# Patient Record
Sex: Male | Born: 1935 | Race: Black or African American | Hispanic: No | Smoking: Never smoker
Health system: Southern US, Community
[De-identification: ages and names within clinical notes are randomized; demographics above are authoritative.]

## PROBLEM LIST (undated history)

## (undated) DIAGNOSIS — H9190 Unspecified hearing loss, unspecified ear: Secondary | ICD-10-CM

## (undated) DIAGNOSIS — E119 Type 2 diabetes mellitus without complications: Secondary | ICD-10-CM

## (undated) DIAGNOSIS — I10 Essential (primary) hypertension: Secondary | ICD-10-CM

## (undated) HISTORY — PX: APPENDECTOMY: SHX54

---

## 2020-08-23 ENCOUNTER — Emergency Department (HOSPITAL_COMMUNITY): Payer: Medicare Other

## 2020-08-23 ENCOUNTER — Other Ambulatory Visit: Payer: Self-pay

## 2020-08-23 ENCOUNTER — Encounter (HOSPITAL_COMMUNITY): Payer: Self-pay | Admitting: *Deleted

## 2020-08-23 ENCOUNTER — Inpatient Hospital Stay (HOSPITAL_COMMUNITY)
Admission: EM | Admit: 2020-08-23 | Discharge: 2020-08-27 | DRG: 683 | Disposition: A | Discharge status: Home Health | Payer: Medicare Other | Attending: Family Medicine | Admitting: Family Medicine

## 2020-08-23 DIAGNOSIS — N281 Cyst of kidney, acquired: Secondary | ICD-10-CM | POA: Diagnosis present

## 2020-08-23 DIAGNOSIS — E872 Acidosis, unspecified: Secondary | ICD-10-CM | POA: Diagnosis present

## 2020-08-23 DIAGNOSIS — R197 Diarrhea, unspecified: Secondary | ICD-10-CM | POA: Diagnosis present

## 2020-08-23 DIAGNOSIS — I10 Essential (primary) hypertension: Secondary | ICD-10-CM | POA: Diagnosis present

## 2020-08-23 DIAGNOSIS — M889 Osteitis deformans of unspecified bone: Secondary | ICD-10-CM | POA: Diagnosis present

## 2020-08-23 DIAGNOSIS — R6510 Systemic inflammatory response syndrome (SIRS) of non-infectious origin without acute organ dysfunction: Secondary | ICD-10-CM | POA: Diagnosis present

## 2020-08-23 DIAGNOSIS — Z20822 Contact with and (suspected) exposure to covid-19: Secondary | ICD-10-CM | POA: Diagnosis present

## 2020-08-23 DIAGNOSIS — N400 Enlarged prostate without lower urinary tract symptoms: Secondary | ICD-10-CM | POA: Diagnosis present

## 2020-08-23 DIAGNOSIS — Z8249 Family history of ischemic heart disease and other diseases of the circulatory system: Secondary | ICD-10-CM | POA: Diagnosis not present

## 2020-08-23 DIAGNOSIS — E1169 Type 2 diabetes mellitus with other specified complication: Secondary | ICD-10-CM | POA: Diagnosis present

## 2020-08-23 DIAGNOSIS — Z7982 Long term (current) use of aspirin: Secondary | ICD-10-CM

## 2020-08-23 DIAGNOSIS — E876 Hypokalemia: Secondary | ICD-10-CM | POA: Diagnosis present

## 2020-08-23 DIAGNOSIS — K5730 Diverticulosis of large intestine without perforation or abscess without bleeding: Secondary | ICD-10-CM | POA: Diagnosis present

## 2020-08-23 DIAGNOSIS — R54 Age-related physical debility: Secondary | ICD-10-CM | POA: Diagnosis present

## 2020-08-23 DIAGNOSIS — R000 Tachycardia, unspecified: Secondary | ICD-10-CM | POA: Diagnosis present

## 2020-08-23 DIAGNOSIS — Z6834 Body mass index (BMI) 34.0-34.9, adult: Secondary | ICD-10-CM

## 2020-08-23 DIAGNOSIS — Z79899 Other long term (current) drug therapy: Secondary | ICD-10-CM | POA: Diagnosis not present

## 2020-08-23 DIAGNOSIS — E860 Dehydration: Secondary | ICD-10-CM | POA: Diagnosis present

## 2020-08-23 DIAGNOSIS — Z7984 Long term (current) use of oral hypoglycemic drugs: Secondary | ICD-10-CM

## 2020-08-23 DIAGNOSIS — R531 Weakness: Secondary | ICD-10-CM

## 2020-08-23 DIAGNOSIS — K449 Diaphragmatic hernia without obstruction or gangrene: Secondary | ICD-10-CM | POA: Diagnosis present

## 2020-08-23 DIAGNOSIS — E669 Obesity, unspecified: Secondary | ICD-10-CM | POA: Diagnosis present

## 2020-08-23 DIAGNOSIS — R509 Fever, unspecified: Secondary | ICD-10-CM | POA: Diagnosis present

## 2020-08-23 DIAGNOSIS — R52 Pain, unspecified: Secondary | ICD-10-CM

## 2020-08-23 DIAGNOSIS — E1165 Type 2 diabetes mellitus with hyperglycemia: Secondary | ICD-10-CM | POA: Diagnosis present

## 2020-08-23 DIAGNOSIS — N179 Acute kidney failure, unspecified: Secondary | ICD-10-CM | POA: Diagnosis present

## 2020-08-23 DIAGNOSIS — K2970 Gastritis, unspecified, without bleeding: Secondary | ICD-10-CM | POA: Diagnosis present

## 2020-08-23 DIAGNOSIS — R053 Chronic cough: Secondary | ICD-10-CM | POA: Diagnosis present

## 2020-08-23 DIAGNOSIS — E119 Type 2 diabetes mellitus without complications: Secondary | ICD-10-CM | POA: Diagnosis not present

## 2020-08-23 HISTORY — DX: Essential (primary) hypertension: I10

## 2020-08-23 HISTORY — DX: Type 2 diabetes mellitus without complications: E11.9

## 2020-08-23 LAB — CBC WITH DIFFERENTIAL/PLATELET
Abs Immature Granulocytes: 0.07 10*3/uL (ref 0.00–0.07)
Basophils Absolute: 0 10*3/uL (ref 0.0–0.1)
Basophils Relative: 0 %
Eosinophils Absolute: 0.6 10*3/uL — ABNORMAL HIGH (ref 0.0–0.5)
Eosinophils Relative: 4 %
HCT: 38.6 % — ABNORMAL LOW (ref 39.0–52.0)
Hemoglobin: 12.7 g/dL — ABNORMAL LOW (ref 13.0–17.0)
Immature Granulocytes: 1 %
Lymphocytes Relative: 6 %
Lymphs Abs: 0.9 10*3/uL (ref 0.7–4.0)
MCH: 32.2 pg (ref 26.0–34.0)
MCHC: 32.9 g/dL (ref 30.0–36.0)
MCV: 97.7 fL (ref 80.0–100.0)
Monocytes Absolute: 0.8 10*3/uL (ref 0.1–1.0)
Monocytes Relative: 6 %
Neutro Abs: 11.7 10*3/uL — ABNORMAL HIGH (ref 1.7–7.7)
Neutrophils Relative %: 83 %
Platelets: 200 10*3/uL (ref 150–400)
RBC: 3.95 MIL/uL — ABNORMAL LOW (ref 4.22–5.81)
RDW: 12.3 % (ref 11.5–15.5)
WBC: 14.1 10*3/uL — ABNORMAL HIGH (ref 4.0–10.5)
nRBC: 0 % (ref 0.0–0.2)

## 2020-08-23 LAB — COMPREHENSIVE METABOLIC PANEL
ALT: 9 U/L (ref 0–44)
AST: 15 U/L (ref 15–41)
Albumin: 3.4 g/dL — ABNORMAL LOW (ref 3.5–5.0)
Alkaline Phosphatase: 80 U/L (ref 38–126)
Anion gap: 9 (ref 5–15)
BUN: 25 mg/dL — ABNORMAL HIGH (ref 8–23)
CO2: 26 mmol/L (ref 22–32)
Calcium: 8.5 mg/dL — ABNORMAL LOW (ref 8.9–10.3)
Chloride: 100 mmol/L (ref 98–111)
Creatinine, Ser: 1.47 mg/dL — ABNORMAL HIGH (ref 0.61–1.24)
GFR, Estimated: 45 mL/min — ABNORMAL LOW (ref >60)
Glucose, Bld: 174 mg/dL — ABNORMAL HIGH (ref 70–99)
Potassium: 3.4 mmol/L — ABNORMAL LOW (ref 3.5–5.1)
Sodium: 135 mmol/L (ref 135–145)
Total Bilirubin: 1.2 mg/dL (ref 0.3–1.2)
Total Protein: 6.8 g/dL (ref 6.5–8.1)

## 2020-08-23 LAB — RESP PANEL BY RT-PCR (FLU A&B, COVID) ARPGX2
Influenza A by PCR: NEGATIVE
Influenza B by PCR: NEGATIVE
SARS Coronavirus 2 by RT PCR: NEGATIVE

## 2020-08-23 LAB — URINALYSIS, ROUTINE W REFLEX MICROSCOPIC
Bilirubin Urine: NEGATIVE
Glucose, UA: NEGATIVE mg/dL
Ketones, ur: 5 mg/dL — AB
Leukocytes,Ua: NEGATIVE
Nitrite: NEGATIVE
Protein, ur: 30 mg/dL — AB
Specific Gravity, Urine: 1.02 (ref 1.005–1.030)
pH: 5 (ref 5.0–8.0)

## 2020-08-23 LAB — LACTIC ACID, PLASMA
Lactic Acid, Venous: 1.8 mmol/L (ref 0.5–1.9)
Lactic Acid, Venous: 2.4 mmol/L (ref 0.5–1.9)

## 2020-08-23 MED ORDER — VANCOMYCIN HCL 1750 MG/350ML IV SOLN: 1750.0000 mg | Freq: Once | INTRAVENOUS | Status: DC

## 2020-08-23 MED ORDER — ONDANSETRON HCL 4 MG/2ML IJ SOLN: 4.0000 mg | Freq: Four times a day (QID) | INTRAMUSCULAR | Status: DC | PRN

## 2020-08-23 MED ORDER — ACETAMINOPHEN 325 MG PO TABS
650.0000 mg | ORAL_TABLET | Freq: Once | ORAL | Status: AC
  Administered 2020-08-23: 650 mg via ORAL
  Filled 2020-08-23: qty 2

## 2020-08-23 MED ORDER — VANCOMYCIN HCL IN DEXTROSE 1-5 GM/200ML-% IV SOLN
1000.0000 mg | Freq: Once | INTRAVENOUS | Status: AC
  Administered 2020-08-23: 1000 mg via INTRAVENOUS
  Filled 2020-08-23: qty 200

## 2020-08-23 MED ORDER — VANCOMYCIN HCL 750 MG/150ML IV SOLN
750.0000 mg | INTRAVENOUS | Status: DC
  Administered 2020-08-24: 750 mg via INTRAVENOUS
  Filled 2020-08-23: qty 150

## 2020-08-23 MED ORDER — ASPIRIN EC 81 MG PO TBEC
81.0000 mg | DELAYED_RELEASE_TABLET | Freq: Every day | ORAL | Status: DC
  Administered 2020-08-24 – 2020-08-27 (×4): 81 mg via ORAL
  Filled 2020-08-23 (×4): qty 1

## 2020-08-23 MED ORDER — SODIUM CHLORIDE 0.9 % IV SOLN: 2.0000 g | Freq: Once | INTRAVENOUS | Status: DC

## 2020-08-23 MED ORDER — SODIUM CHLORIDE 0.9 % IV BOLUS
1000.0000 mL | Freq: Once | INTRAVENOUS | Status: AC
  Administered 2020-08-23: 1000 mL via INTRAVENOUS

## 2020-08-23 MED ORDER — METRONIDAZOLE IN NACL 5-0.79 MG/ML-% IV SOLN
500.0000 mg | Freq: Three times a day (TID) | INTRAVENOUS | Status: DC
  Administered 2020-08-24 – 2020-08-26 (×8): 500 mg via INTRAVENOUS
  Filled 2020-08-23 (×8): qty 100

## 2020-08-23 MED ORDER — SIMVASTATIN 20 MG PO TABS
20.0000 mg | ORAL_TABLET | Freq: Every day | ORAL | Status: DC
  Administered 2020-08-24 – 2020-08-26 (×3): 20 mg via ORAL
  Filled 2020-08-23 (×3): qty 1

## 2020-08-23 MED ORDER — ONDANSETRON HCL 4 MG PO TABS: 4.0000 mg | ORAL_TABLET | Freq: Four times a day (QID) | ORAL | Status: DC | PRN

## 2020-08-23 MED ORDER — SODIUM CHLORIDE 0.9 % IV SOLN
2.0000 g | Freq: Two times a day (BID) | INTRAVENOUS | Status: DC
  Administered 2020-08-24 – 2020-08-26 (×5): 2 g via INTRAVENOUS
  Filled 2020-08-23 (×6): qty 2

## 2020-08-23 MED ORDER — ACETAMINOPHEN 325 MG PO TABS
650.0000 mg | ORAL_TABLET | Freq: Four times a day (QID) | ORAL | Status: DC | PRN
  Administered 2020-08-23 – 2020-08-26 (×4): 650 mg via ORAL
  Filled 2020-08-23 (×4): qty 2

## 2020-08-23 MED ORDER — ACETAMINOPHEN 650 MG RE SUPP
650.0000 mg | Freq: Four times a day (QID) | RECTAL | Status: DC | PRN
  Filled 2020-08-23: qty 1

## 2020-08-23 MED ORDER — VANCOMYCIN HCL 750 MG/150ML IV SOLN
750.0000 mg | Freq: Once | INTRAVENOUS | Status: AC
  Administered 2020-08-23: 750 mg via INTRAVENOUS
  Filled 2020-08-23 (×2): qty 150

## 2020-08-23 MED ORDER — HEPARIN SODIUM (PORCINE) 5000 UNIT/ML IJ SOLN
5000.0000 [IU] | Freq: Three times a day (TID) | INTRAMUSCULAR | Status: DC
  Administered 2020-08-24 – 2020-08-27 (×9): 5000 [IU] via SUBCUTANEOUS
  Filled 2020-08-23 (×10): qty 1

## 2020-08-23 MED ORDER — FAMOTIDINE 20 MG PO TABS
40.0000 mg | ORAL_TABLET | Freq: Every day | ORAL | Status: DC
  Administered 2020-08-24 – 2020-08-25 (×2): 40 mg via ORAL
  Filled 2020-08-23 (×2): qty 2

## 2020-08-23 MED ORDER — OXYCODONE HCL 5 MG PO TABS
5.0000 mg | ORAL_TABLET | ORAL | Status: DC | PRN
Start: 2020-08-23 — End: 2020-08-27

## 2020-08-23 MED ORDER — VANCOMYCIN HCL IN DEXTROSE 1-5 GM/200ML-% IV SOLN: 1000.0000 mg | Freq: Once | INTRAVENOUS | Status: DC

## 2020-08-23 MED ORDER — SODIUM CHLORIDE 0.9 % IV BOLUS
500.0000 mL | Freq: Once | INTRAVENOUS | Status: AC
  Administered 2020-08-23: 500 mL via INTRAVENOUS

## 2020-08-23 MED ORDER — CARVEDILOL 3.125 MG PO TABS
3.1250 mg | ORAL_TABLET | Freq: Every day | ORAL | Status: DC
  Administered 2020-08-24 – 2020-08-27 (×4): 3.125 mg via ORAL
  Filled 2020-08-23 (×4): qty 1

## 2020-08-23 MED ORDER — TRAZODONE HCL 50 MG PO TABS
50.0000 mg | ORAL_TABLET | Freq: Every day | ORAL | Status: DC
  Administered 2020-08-24 – 2020-08-26 (×4): 50 mg via ORAL
  Filled 2020-08-23 (×3): qty 1

## 2020-08-23 MED ORDER — SODIUM CHLORIDE 0.9 % IV SOLN: INTRAVENOUS | Status: DC

## 2020-08-23 MED ORDER — SODIUM CHLORIDE 0.9 % IV SOLN
2.0000 g | Freq: Once | INTRAVENOUS | Status: AC
  Administered 2020-08-23: 2 g via INTRAVENOUS
  Filled 2020-08-23: qty 2

## 2020-08-23 MED ORDER — INSULIN ASPART 100 UNIT/ML ~~LOC~~ SOLN
0.0000 [IU] | Freq: Three times a day (TID) | SUBCUTANEOUS | Status: DC
  Administered 2020-08-24 – 2020-08-25 (×4): 1 [IU] via SUBCUTANEOUS
  Administered 2020-08-25 – 2020-08-26 (×2): 2 [IU] via SUBCUTANEOUS
  Administered 2020-08-26 – 2020-08-27 (×3): 1 [IU] via SUBCUTANEOUS

## 2020-08-23 MED ORDER — AMLODIPINE BESYLATE 5 MG PO TABS
5.0000 mg | ORAL_TABLET | Freq: Every day | ORAL | Status: DC
  Administered 2020-08-24 – 2020-08-27 (×3): 5 mg via ORAL
  Filled 2020-08-23 (×4): qty 1

## 2020-08-23 NOTE — Progress Notes (Signed)
Pharmacy Antibiotic Note  Louis Henderson is a 87 y.o. male admitted on 08/23/2020 fever, chills, lactic acid = 2.4, AKI.  Pharmacy has been consulted for cefepime and vancomycin dosing for sepsis.  Plan: Cefepime 2 gm IV q 12 hr. Vancomycin 1750 mg IV load then 750 mg IV 24 hr. Calc AUC: 486.8 Will follow levels, renal function, cultures, and clinical status.  Height: 5\' 3"  (160 cm) Weight: 87.3 kg (192 lb 7.4 oz) IBW/kg (Calculated) : 56.9  Temp (24hrs), Avg:100.4 F (38 C), Min:99.7 F (37.6 C), Max:102.2 F (39 C)  Recent Labs  Lab 08/23/20 1411 08/23/20 1533 08/23/20 1717  WBC 14.1*  --   --   CREATININE 1.47*  --   --   LATICACIDVEN  --  1.8 2.4*    Estimated Creatinine Clearance: 33.3 mL/min (A) (by C-G formula based on SCr of 1.47 mg/dL (H)).    No Known Allergies  Antimicrobials this admission: Cefepime 3/29 >> Vancomycin 3/29 >>   Dose adjustments this admission:  Cefepime from 2 gm IV q 8 hr.  Microbiology results: BCx: pending  Thank you for allowing pharmacy to be a part of this patient's care.  4/29, RPh Clinical Pharmacist 08/23/2020 9:43 PM

## 2020-08-23 NOTE — ED Provider Notes (Signed)
[name]   CSN: [id number] Arrival date & time: 08/23/20  1319     History Chief Complaint  Patient presents with  . Weakness    Louis InchesClarence Henderson is a 87 y.o. male.  HPI Patient is an 87 year old male with a history of hypertension as well as diabetes mellitus who presents the emergency department due to chills.  He states that yesterday he began experiencing intermittent diarrhea, fatigue, chills, body aches, sore throat, sneezing, as well as a cough.  Denies any chest pain or shortness of breath.  No abdominal pain.  No urinary complaints.  Patient states he slept very little last night due to his symptoms and reports significant fatigue at this time.  Cardiologist [facility] Lov[facility]Lawrence Crawford [facility]. PCP is Nancy NordmannDiane Elliott in [place].     Past Medical History:  Diagnosis Date  . Diabetes mellitus without complication (HCC)   . Hypertension     There are no problems to display for this patient.   Past Surgical History:  Procedure Laterality Date  . APPENDECTOMY       No family history on file.  Social History   Tobacco Use  . Smoking status: Never Smoker  . Smokeless tobacco: Never Used  Substance Use Topics  . Alcohol use: Not Currently  . Drug use: Not Currently    Home Medications Prior to Admission medications   Medication Sig Start Date End Date Taking? Authorizing Provider  acetaminophen (TYLENOL) 500 MG tablet Take 1,000 mg by mouth every 8 (eight) hours as needed for moderate pain.   Yes [provider]  ascorbic acid (VITAMIN C) 1000 MG tablet Take 1,000 mg by mouth daily.   Yes [provider]  aspirin 325 MG EC tablet Take by mouth.   Yes [provider]  carvedilol (COREG) 3.125 MG tablet Take 3.125 mg by mouth daily. 02/08/20 02/07/21 Yes [provider]  Cholecalciferol 25 MCG (1000 UT) tablet Take 1,000 Units by mouth daily.   Yes [provider]   famotidine (PEPCID) 40 MG tablet Take 40 mg by mouth daily.   Yes [provider]  latanoprost (XALATAN) 0.005 % ophthalmic solution Place 1 drop into the left eye at bedtime. 05/14/19  Yes [provider]  losartan-hydrochlorothiazide (HYZAAR) 100-12.5 MG tablet Take 1 tablet by mouth daily.   Yes [provider]  metFORMIN (GLUCOPHAGE) 500 MG tablet Take 1,000 mg by mouth 2 (two) times daily with a meal.   Yes [provider]  nystatin-triamcinolone (MYCOLOG II) cream APPLY 1 APPLICATION TOPICALLY AA BID PRN 08/27/17  Yes [provider]  ondansetron (ZOFRAN-ODT) 4 MG disintegrating tablet Take 4 mg by mouth every 8 (eight) hours as needed for nausea or vomiting. 05/05/20  Yes [provider]  potassium chloride (MICRO-K) 10 MEQ CR capsule Take 1 capsule by mouth daily. 08/27/17  Yes [provider]  pyridoxine (B-6) 100 MG tablet Take 100 mg by mouth daily.   Yes [provider]  simvastatin (ZOCOR) 20 MG tablet Take 20 mg by mouth daily at 6 PM.   Yes [provider]  traZODone (DESYREL) 50 MG tablet Take 50 mg by mouth at bedtime.   Yes [provider]  amLODipine (NORVASC) 5 MG tablet Take 5 mg by mouth daily. Patient not taking: Reported on 08/23/2020 05/07/20   [provider]  dicyclomine (BENTYL) 20 MG tablet Take 20 mg by mouth every 6 (six) hours as needed. Patient  not taking: Reported on 08/23/2020 05/05/20   [provider]  glimepiride (AMARYL) 4 MG tablet Take by mouth. Patient not taking: Reported on 08/23/2020    [provider]  pantoprazole (PROTONIX) 40 MG tablet Take by mouth. Patient not taking: Reported on 08/23/2020 05/05/20   [provider]  ranitidine (ZANTAC) 150 MG capsule Take by mouth. Patient not taking: Reported on 08/23/2020    [provider]    Allergies    Patient has no known allergies.  Review of Systems   Review of Systems   All other systems reviewed and are negative. Ten systems reviewed and are negative for acute change, except as noted in the HPI.   Physical Exam Updated Vital Signs BP (!) 151/72   Pulse 100   Temp 99.7 F (37.6 C) (Oral)   Resp (!) 30   SpO2 100%   Physical Exam Vitals and nursing note reviewed.  Constitutional:      General: He is not in acute distress.    Appearance: Normal appearance. He is not ill-appearing, toxic-appearing or diaphoretic.     Comments: Elderly well-developed adult male.  Sitting upright.  Hard of hearing but answers questions clearly when asked.    HENT:     Head: Normocephalic and atraumatic.     Right Ear: External ear normal.     Left Ear: External ear normal.     Nose: Nose normal.     Mouth/Throat:     Mouth: Mucous membranes are moist.     Pharynx: Oropharynx is clear. No oropharyngeal exudate or posterior oropharyngeal erythema.     Comments: Uvula midline.  No significant erythema noted in the posterior oropharynx.  Readily handling secretions.  No hot potato voice. Eyes:     General: No scleral icterus.       Right eye: No discharge.        Left eye: No discharge.     Extraocular Movements: Extraocular movements intact.     Conjunctiva/sclera: Conjunctivae normal.  Cardiovascular:     Rate and Rhythm: Regular rhythm. Tachycardia present.     Pulses: Normal pulses.     Heart sounds: Normal heart sounds. No murmur heard. No friction rub. No gallop.   Pulmonary:     Effort: Pulmonary effort is normal. No respiratory distress.     Breath sounds: Normal breath sounds. No stridor. No wheezing, rhonchi or rales.  Abdominal:     General: Abdomen is flat.     Palpations: Abdomen is soft.     Tenderness: There is no abdominal tenderness.     Comments: Protuberant abdomen that is soft and nontender.  Musculoskeletal:        General: Normal range of motion.     Cervical back: Normal range of motion and neck supple. No tenderness.  Skin:     General: Skin is warm and dry.  Neurological:     General: No focal deficit present.     Mental Status: He is alert and oriented to person, place, and time.  Psychiatric:        Mood and Affect: Mood normal.        Behavior: Behavior normal.    ED Results / Procedures / Treatments   Labs (all labs ordered are listed, but only abnormal results are displayed) Labs Reviewed  COMPREHENSIVE METABOLIC PANEL - Abnormal; Notable for the following components:      Result Value   Potassium 3.4 (*)    Glucose, Bld 174 (*)  BUN 25 (*)    Creatinine, Ser 1.47 (*)    Calcium 8.5 (*)    Albumin 3.4 (*)    GFR, Estimated 45 (*)    All other components within normal limits  CBC WITH DIFFERENTIAL/PLATELET - Abnormal; Notable for the following components:   WBC 14.1 (*)    RBC 3.95 (*)    Hemoglobin 12.7 (*)    HCT 38.6 (*)    Neutro Abs 11.7 (*)    Eosinophils Absolute 0.6 (*)    All other components within normal limits  URINALYSIS, ROUTINE W REFLEX MICROSCOPIC - Abnormal; Notable for the following components:   APPearance CLOUDY (*)    Hgb urine dipstick SMALL (*)    Ketones, ur 5 (*)    Protein, ur 30 (*)    Bacteria, UA RARE (*)    All other components within normal limits  LACTIC ACID, PLASMA - Abnormal; Notable for the following components:   Lactic Acid, Venous 2.4 (*)    All other components within normal limits  RESP PANEL BY RT-PCR (FLU A&B, COVID) [id number]  CULTURE, BLOOD (ROUTINE X 2)  CULTURE, BLOOD (ROUTINE X 2)  LACTIC ACID, PLASMA    EKG EKG Interpretation  Date/Time:  Tuesday August 23 2020 14:14:13 EDT Ventricular Rate:  108 PR Interval:  209 QRS Duration: 77 QT Interval:  330 QTC Calculation: 443 R Axis:   -35 Text Interpretation: Sinus tachycardia Abnormal R-wave progression, late transition Inferior infarct, old No old tracing to compare Confirmed by Benjiman Core [phone]) on 08/23/2020 2:28:59 PM   Radiology CT ABDOMEN PELVIS WO CONTRAST  Result  Date: 08/23/2020 CLINICAL DATA:  Acute abdominal pain. Diffuse pain with diarrhea. Weakness. EXAM: CT ABDOMEN AND PELVIS WITHOUT CONTRAST TECHNIQUE: Multidetector CT imaging of the abdomen and pelvis was performed following the standard protocol without IV contrast. COMPARISON:  None. FINDINGS: Lower chest: Subsegmental atelectasis in the lingula. Mild hypoventilatory change in the dependent lungs. No confluent consolidation or pleural fluid. Aortic valvular calcifications. Normal heart size. Hepatobiliary: No focal hepatic lesion on noncontrast exam. Mild gallbladder distension but no calcified gallstone. No pericholecystic inflammation. No biliary dilatation. Pancreas: No ductal dilatation or inflammation. Spleen: Normal in size without focal abnormality. Adrenals/Urinary Tract: Adrenal nodule. Slight adrenal thickening. No hydronephrosis. No perinephric edema. Cyst arising from the upper pole of the left kidney measures 7.4 cm and has thin calcified septations. Cyst arising from the lower left kidney measures 5.7 cm without internal septations. Small low-density lesion arising from the mid right kidney is too small to characterize but likely cyst. Calcification in the right renal hilum is felt to be vascular rather than nonobstructing stone. Urinary bladder is partially distended, elevated by prominently enlarged prostate gland. Stomach/Bowel: Small hiatal hernia. Partially distended stomach. Mild peri pyloric gastric wall thickening, series 2, image 29. No small bowel obstruction or inflammation. There are a few fluid-filled small bowel loops in the pelvis without perienteric inflammation or evidence of wall thickening. Appendix is not definitively seen, no evidence of appendicitis. Nondistended ascending colon which limits assessment for wall thickening. Moderate stool in the transverse, descending, and sigmoid colon. No wall thickening of these colonic segments. Occasional colonic diverticula. There is  question of irregular wall thickening involving the right rectosigmoid junction, for example series 2, images 75 and 79. no adjacent inflammation. Vascular/Lymphatic: Moderately advanced aortic atherosclerosis. No aortic aneurysm. Moderate branch atherosclerosis. No enlarged lymph nodes in the abdomen or pelvis. Reproductive: Prostate gland is prominently enlarged spanning 6.6 x 7.4 x  6.8 cm (volume = 170 cm^3). This causes mass effect on the bladder base. Other: No free air or ascites.  No abdominal wall hernia. Musculoskeletal: Diffuse degenerative change throughout the spine with degenerative disc disease and facet hypertrophy. Degenerative change of the pubic symphysis and both hips. Cortical and trabecular thickening involving the left hemipelvis typical of Paget's disease. There may be some cortical thickening involving the posterior right iliac bone as well. IMPRESSION: 1. Questionable irregular wall thickening involving the right rectosigmoid junction. Recommend correlation with physical and direct visualization/colonoscopy to exclude underlying colonic neoplasm. 2. Colonic diverticulosis without diverticulitis. 3. Mild peri pyloric gastric wall thickening, can be seen with gastritis or peptic ulcer disease. Small hiatal hernia. 4. Prominently enlarged prostate gland causing mass effect on the bladder base. 5. Left renal cysts, including a minimally complex cyst arising from the upper pole with a calcified septation. 6. Paget's disease of the left hemipelvis. There may be some cortical thickening involving the posterior right iliac bone as well. Aortic Atherosclerosis (ICD10-I70.0). Electronically Signed   By: Narda Rutherford M.D.   On: 08/23/2020 16:57   DG Chest Portable 1 View  Result Date: 08/23/2020 CLINICAL DATA:  Weakness, chills and diarrhea beginning yesterday. EXAM: PORTABLE CHEST 1 VIEW COMPARISON:  None. FINDINGS: The lungs are clear. Heart size is normal. Aortic atherosclerosis. No  pneumothorax or pleural fluid. No acute or focal bony abnormality. IMPRESSION: No acute disease. Aortic Atherosclerosis (ICD10-I70.0). Electronically Signed   By: Drusilla Kanner M.D.   On: 08/23/2020 15:02    Procedures Procedures   Medications Ordered in ED Medications  sodium chloride 0.9 % bolus 1,000 mL (1,000 mLs Intravenous New Bag/Given 08/23/20 1606)  acetaminophen (TYLENOL) tablet 650 mg (650 mg Oral Given 08/23/20 1606)    ED Course  I have reviewed the triage vital signs and the nursing notes.  Pertinent labs & imaging results that were available during my care of the patient were reviewed by me and considered in my medical decision making (see chart for details).  Clinical Course as of 08/23/20 1837  Tue Aug 23, 2020  1411 NEUT#(!): 11.7 [LJ]  1437 WBC(!): 14.1 [LJ]    Clinical Course User Index [LJ] Placido Sou, PA-C   MDM Rules/Calculators/A&P                          Pt is a 87 y.o. male who presents the emergency department with cough, rhinorrhea, chills, diarrhea, as well as fatigue and weakness.  Labs: CBC with a white count of 14.1, hemoglobin of 12.7, neutrophils of 11.7, eosinophils of 0.6. CMP with a potassium of 3.4, glucose of 174, BUN of 25, creatinine 1.47, GFR 45. Blood cultures obtained. Respiratory panel is negative. Lactic acid initially of 1.8 with a repeat of 2.4 after patient given 1 L of IV fluids.  Imaging: Please see findings regarding chest x-ray as well as CT of the abdomen pelvis above.  I, Placido Sou, PA-C, personally reviewed and evaluated these images and lab results as part of my medical decision-making.  Unsure of the cause of the patient's symptoms.  He does have an acute leukocytosis, AKI, as well as a rising lactic acid.  Patient given 1 L of IV fluids and his lactic acid has risen from 1.8 to 2.4.  He is tachycardic around 100 bpm.  He has had a low-grade fever with a rectal temp of 100 F.  His respiratory panel is  negative.  His chest  x-ray is negative.  CT without contrast of his abdomen and pelvis did not show any infectious etiology.  Feel that given patient's age and lab work that admission would be beneficial for IV fluids as well as monitoring.  Will discuss with the medicine team.  Note: Portions of this report may have been transcribed using voice recognition software. Every effort was made to ensure accuracy; however, inadvertent computerized transcription errors may be present.   Final Clinical Impression(s) / ED Diagnoses Final diagnoses:  Weakness  Dehydration   Rx / DC Orders ED Discharge Orders    None       Placido Sou, PA-C 08/23/20 Marcial Pacas, MD 08/23/20 1946

## 2020-08-23 NOTE — ED Notes (Signed)
Dr.Zierle-Ghosh made aware of pt being in ED, not able to go to assigned unit at this time. Aware of VS, verbal order for 500NS bolus STAT once. Orders placed.

## 2020-08-23 NOTE — ED Triage Notes (Signed)
Episode of chills yesterday, family member states he had diarrhea yesterday, today is very weak and denies pain

## 2020-08-23 NOTE — ED Notes (Signed)
Date and time results received: 08/23/20 1801  Test: Lactic Acid Critical Value: 2.4  Name of Provider Notified: Whitney Post PA  Orders Received? Or Actions Taken?: n/a

## 2020-08-23 NOTE — ED Notes (Signed)
Pt heart rate 123, oral temp 102.62F. Dr. Carren Rang made aware, pt has signed and held orders for tylenol will be released, pt to have one dose now. antiobiotics infusing at this time. Pt bed ready, will go upstairs.

## 2020-08-23 NOTE — H&P (Signed)
TRH H&P    Patient Demographics:    Louis Henderson, is a 87 y.o. male  MRN: [id number]  DOB - March 14, 1929  Admit Date - 08/23/2020  Referring MD/NP/PA: Whitney Post  Outpatient Primary MD for the patient is Richarda Blade E  Patient coming from: home  Chief complaint- Fever   HPI:    Louis Henderson  is a 87 y.o. male, with hx HTN and DMII with a chief complaint of fever. Patient asks that I take history from his daughter.  Daughter reports that patient is here today because he was shivering all through the night.  She reports that he had a low-grade fever of 99.8 at home.  She reports that he states "that she took extra blankets" and got in bed with him overnight, but he was still not warm.  She reports that he did not stop shivering until about 1 AM.  Daughter reports that patient is not normally incontinent of his bowels but he was last night.  She does report that he has been having diarrhea for several weeks, but she cannot member how many weeks.  She reports watery-like stools 3 or 4 times a day.  She says sometimes they are just "squirts."  She has not seen any blood or melena.  She reports no nausea or vomiting.  Daughter reports that patient always has a cough that productive of white sputum, but his cough has not changed.  He has not complained of chest pain, dysuria, sores or rashes on his body.  Patient has had a decreased appetite.  She reports that he normally eats small servings, but today he did not eat at all.  She does report that he has had darker urine than normal as well.  Lastly she reports that he has had generalized weakness.  Normally daughter is able to help patient transfer from bed or chair to wheelchair, but he has been so weak that she has not been able to help him do that today.  Patient has poor insight into his health.  Continues to try to leave AGAINST MEDICAL ADVICE, requiring multiple  providers to explain to him again why it is best for him to stay in the ED.  With that explanation patient does agree to stay.  In the ED Temp 99.7, but goes as high as 102.2, heart rate 100-122, respiratory rate 20-23, blood pressure 129/67, satting at 99% Lactic acid increasing from 1.8- 2.4 CT abdomen shows wall thickening right rectosigmoid junction.  Also shows colonic diverticulosis without diverticulitis.  Mild peripyloric gastric wall thickening that can be seen with gastritis or peptic ulcer disease.  Small hiatal hernia.  Prominently enlarged prostate gland causing mass-effect on the bladder base.  Left renal cyst including minimally complex cyst arising from the upper pole with a calcified septation.  Paget's disease of the left hemipelvis. White blood cell count 14,000, hemoglobin 12.7 Minimal hypokalemia at 3.4 Blood cultures pending Chemistry panel reveals a AKI with a creatinine baseline of 1.2, creatinine today 1.47 Hyperglycemia in the setting of diabetes mellitus type 2  at 174 UA was not suspicious for UTI EKG shows a heart rate of 108, sinus tach Admission requested for further work-up    Review of systems:    In addition to the HPI above,  Admits to fever and chills No Headache, No changes with Vision or hearing, No problems swallowing food or Liquids, No Chest pain, admits to chronic Cough, no Shortness of Breath, No Abdominal pain, No Nausea or Vomiting, admits to diarrhea  no Blood in stool or Urine, No dysuria, No new skin rashes or bruises, No new joints pains-aches,  No new weakness, tingling, numbness in any extremity, No recent weight gain or loss, No polyuria, polydypsia or polyphagia, No significant Mental Stressors.  All other systems reviewed and are negative.    Past History of the following :    Past Medical History:  Diagnosis Date  . Diabetes mellitus without complication (HCC)   . Hypertension       Past Surgical History:  Procedure  Laterality Date  . APPENDECTOMY        Social History:      Social History   Tobacco Use  . Smoking status: Never Smoker  . Smokeless tobacco: Never Used  Substance Use Topics  . Alcohol use: Not Currently       Family History :    No family history on file. Family history hypertension   Home Medications:   Prior to Admission medications   Medication Sig Start Date End Date Taking? Authorizing Provider  acetaminophen (TYLENOL) 500 MG tablet Take 1,000 mg by mouth every 8 (eight) hours as needed for moderate pain.   Yes [provider]  ascorbic acid (VITAMIN C) 1000 MG tablet Take 1,000 mg by mouth daily.   Yes [provider]  aspirin 325 MG EC tablet Take by mouth.   Yes [provider]  carvedilol (COREG) 3.125 MG tablet Take 3.125 mg by mouth daily. 02/08/20 02/07/21 Yes [provider]  Cholecalciferol 25 MCG (1000 UT) tablet Take 1,000 Units by mouth daily.   Yes [provider]  famotidine (PEPCID) 40 MG tablet Take 40 mg by mouth daily.   Yes [provider]  latanoprost (XALATAN) 0.005 % ophthalmic solution Place 1 drop into the left eye at bedtime. 05/14/19  Yes [provider]  losartan-hydrochlorothiazide (HYZAAR) 100-12.5 MG tablet Take 1 tablet by mouth daily.   Yes [provider]  metFORMIN (GLUCOPHAGE) 500 MG tablet Take 1,000 mg by mouth 2 (two) times daily with a meal.   Yes [provider]  nystatin-triamcinolone (MYCOLOG II) cream APPLY 1 APPLICATION TOPICALLY AA BID PRN 08/27/17  Yes [provider]  ondansetron (ZOFRAN-ODT) 4 MG disintegrating tablet Take 4 mg by mouth every 8 (eight) hours as needed for nausea or vomiting. 05/05/20  Yes [provider]  potassium chloride (MICRO-K) 10 MEQ CR capsule Take 1 capsule by mouth daily. 08/27/17  Yes [provider]  pyridoxine (B-6) 100 MG tablet Take 100 mg by mouth daily.   Yes [provider]   simvastatin (ZOCOR) 20 MG tablet Take 20 mg by mouth daily at 6 PM.   Yes [provider]  traZODone (DESYREL) 50 MG tablet Take 50 mg by mouth at bedtime.   Yes [provider]  amLODipine (NORVASC) 5 MG tablet Take 5 mg by mouth daily. Patient not taking: Reported on 08/23/2020 05/07/20   [provider]  dicyclomine (BENTYL) 20 MG tablet Take 20 mg by mouth every 6 (  six) hours as needed. Patient not taking: Reported on 08/23/2020 05/05/20   [provider]  glimepiride (AMARYL) 4 MG tablet Take by mouth. Patient not taking: Reported on 08/23/2020    [provider]  pantoprazole (PROTONIX) 40 MG tablet Take by mouth. Patient not taking: Reported on 08/23/2020 05/05/20   [provider]  ranitidine (ZANTAC) 150 MG capsule Take by mouth. Patient not taking: Reported on 08/23/2020    [provider]     Allergies:    No Known Allergies   Physical Exam:   Vitals  Blood pressure (!) 151/72, pulse 100, temperature 99.7 F (37.6 C), temperature source Oral, resp. rate (!) 30, SpO2 100 %.  1.  General: Patient lying left lateral decubitus in bed, no acute distress  2. Psychiatric: Oriented to self and place, poor insight to healthcare, cooperative with exam, flat affect  3. Neurologic: Face is symmetric, speech and language are normal, moves all 4 extremities voluntarily, no focal deficit on limited exam  4. HEENMT:  Head is atraumatic,, pupils are reactive to light, neck is supple, trachea is midline  5. Respiratory : Lungs are clear to auscultation bilaterally without wheezes, rhonchi, crackles, no clubbing, no cyanosis  6. Cardiovascular : Heart rate is tachycardic, rhythm is regular, murmur present, no rubs or gallops  7. Gastrointestinal:  Abdomen is soft, nondistended, nontender to palpation, bowel sounds active  8. Skin:  Skin is warm dry and intact without acute lesion on limited exam  9.Musculoskeletal:   Calf tenderness, no acute deformity, no peripheral edema    Data Review:    CBC Recent Labs  Lab 08/23/20 1411  WBC 14.1*  HGB 12.7*  HCT 38.6*  PLT 200  MCV 97.7  MCH 32.2  MCHC 32.9  RDW 12.3  LYMPHSABS 0.9  MONOABS 0.8  EOSABS 0.6*  BASOSABS 0.0   ------------------------------------------------------------------------------------------------------------------  Results for orders placed or performed during the hospital encounter of 08/23/20 (from the past 48 hour(s))  Urinalysis, Routine w reflex microscopic     Status: Abnormal   Collection Time: 08/23/20  2:07 PM  Result Value Ref Range   Color, Urine YELLOW YELLOW   APPearance CLOUDY (A) CLEAR   Specific Gravity, Urine 1.020 1.005 - 1.030   pH 5.0 5.0 - 8.0   Glucose, UA NEGATIVE NEGATIVE mg/dL   Hgb urine dipstick SMALL (A) NEGATIVE   Bilirubin Urine NEGATIVE NEGATIVE   Ketones, ur 5 (A) NEGATIVE mg/dL   Protein, ur 30 (A) NEGATIVE mg/dL   Nitrite NEGATIVE NEGATIVE   Leukocytes,Ua NEGATIVE NEGATIVE   RBC / HPF 0-5 0 - 5 RBC/hpf   WBC, UA 0-5 0 - 5 WBC/hpf   Bacteria, UA RARE (A) NONE SEEN   Squamous Epithelial / LPF 0-5 0 - 5   Mucus PRESENT     Comment: Performed at [facility], [address], [place], [place] [address]  Comprehensive metabolic panel     Status: Abnormal   Collection Time: 08/23/20  2:11 PM  Result Value Ref Range   Sodium 135 135 - 145 mmol/L   Potassium 3.4 (L) 3.5 - 5.1 mmol/L   Chloride 100 98 - 111 mmol/L   CO2 26 22 - 32 mmol/L   Glucose, Bld 174 (H) 70 - 99 mg/dL    Comment: Glucose reference range applies only to samples taken after fasting for at least 8 hours.   BUN 25 (H) 8 - 23 mg/dL   Creatinine, Ser 9.14 (H) 0.61 - 1.24 mg/dL  Calcium 8.5 (L) 8.9 - 10.3 mg/dL   Total Protein 6.8 6.5 - 8.1 g/dL   Albumin 3.4 (L) 3.5 - 5.0 g/dL   AST 15 15 - 41 U/L   ALT 9 0 - 44 U/L   Alkaline Phosphatase 80 38 - 126 U/L   Total Bilirubin 1.2 0.3 - 1.2 mg/dL   GFR, Estimated 45  (L) >60 mL/min    Comment: (NOTE) Calculated using the CKD-EPI Creatinine Equation (2021)    Anion gap 9 5 - 15    Comment: Performed at [facility], [address] [place], [place] [address]  CBC with Differential     Status: Abnormal   Collection Time: 08/23/20  2:11 PM  Result Value Ref Range   WBC 14.1 (H) 4.0 - 10.5 K/uL   RBC 3.95 (L) 4.22 - 5.81 MIL/uL   Hemoglobin 12.7 (L) 13.0 - 17.0 g/dL   HCT 84.6 (L) 96.2 - 95.2 %   MCV 97.7 80.0 - 100.0 fL   MCH 32.2 26.0 - 34.0 pg   MCHC 32.9 30.0 - 36.0 g/dL   RDW 84.1 32.4 - 40.1 %   Platelets 200 150 - 400 K/uL   nRBC 0.0 0.0 - 0.2 %   Neutrophils Relative % 83 %   Neutro Abs 11.7 (H) 1.7 - 7.7 K/uL   Lymphocytes Relative 6 %   Lymphs Abs 0.9 0.7 - 4.0 K/uL   Monocytes Relative 6 %   Monocytes Absolute 0.8 0.1 - 1.0 K/uL   Eosinophils Relative 4 %   Eosinophils Absolute 0.6 (H) 0.0 - 0.5 K/uL   Basophils Relative 0 %   Basophils Absolute 0.0 0.0 - 0.1 K/uL   Immature Granulocytes 1 %   Abs Immature Granulocytes 0.07 0.00 - 0.07 K/uL    Comment: Performed at [facility], [address], [place], [place] [address]  Resp Panel by RT-PCR (Flu A&B, Covid) Nasopharyngeal Swab     Status: None   Collection Time: 08/23/20  2:11 PM   Specimen: Nasopharyngeal Swab; Nasopharyngeal(NP) swabs in vial transport medium  Result Value Ref Range   SARS Coronavirus 2 by RT PCR NEGATIVE NEGATIVE    Comment: (NOTE) SARS-CoV-2 target nucleic acids are NOT DETECTED.  The SARS-CoV-2 RNA is generally detectable in upper respiratory specimens during the acute phase of infection. The lowest concentration of SARS-CoV-2 viral copies this assay can detect is 138 copies/mL. A negative result does not preclude SARS-Cov-2 infection and should not be used as the sole basis for treatment or other patient management decisions. A negative result may occur with  improper specimen collection/handling, submission of specimen other than nasopharyngeal swab,  presence of viral mutation(s) within the areas targeted by this assay, and inadequate number of viral copies(<138 copies/mL). A negative result must be combined with clinical observations, patient history, and epidemiological information. The expected result is Negative.  Fact Sheet for Patients:  BloggerCourse.com  Fact Sheet for Healthcare Providers:  SeriousBroker.it  This test is no t yet approved or cleared by the [place] FDA and  has been authorized for detection and/or diagnosis of SARS-CoV-2 by FDA under an Emergency Use Authorization (EUA). This EUA will remain  in effect (meaning this test can be used) for the duration of the COVID-19 declaration under Section 564(b)(1) of the Act, 21 U.S.C.section 360bbb-3(b)(1), unless the authorization is terminated  or revoked sooner.       Influenza A by PCR NEGATIVE NEGATIVE   Influenza B by PCR NEGATIVE NEGATIVE  Comment: (NOTE) The Xpert Xpress SARS-CoV-2/FLU/RSV plus assay is intended as an aid in the diagnosis of influenza from Nasopharyngeal swab specimens and should not be used as a sole basis for treatment. Nasal washings and aspirates are unacceptable for Xpert Xpress SARS-CoV-2/FLU/RSV testing.  Fact Sheet for Patients: BloggerCourse.com  Fact Sheet for Healthcare Providers: SeriousBroker.it  This test is not yet approved or cleared by the [place] FDA and has been authorized for detection and/or diagnosis of SARS-CoV-2 by FDA under an Emergency Use Authorization (EUA). This EUA will remain in effect (meaning this test can be used) for the duration of the COVID-19 declaration under Section 564(b)(1) of the Act, 21 U.S.C. section 360bbb-3(b)(1), unless the authorization is terminated or revoked.  Performed at [facility], [address], [place], [place] [address]   Lactic acid, plasma     Status: None    Collection Time: 08/23/20  3:33 PM  Result Value Ref Range   Lactic Acid, Venous 1.8 0.5 - 1.9 mmol/L    Comment: Performed at [facility], [address], [place], [place] [address]  Culture, blood (routine x 2)     Status: None (Preliminary result)   Collection Time: 08/23/20  3:33 PM   Specimen: BLOOD  Result Value Ref Range   Specimen Description BLOOD BLOOD RIGHT HAND    Special Requests      Blood Culture results may not be optimal due to an inadequate volume of blood received in culture bottles BOTTLES DRAWN AEROBIC AND ANAEROBIC Performed at [facility], [address] [place], [place] [address]    Culture PENDING    Report Status PENDING   Culture, blood (routine x 2)     Status: None (Preliminary result)   Collection Time: 08/23/20  3:33 PM   Specimen: BLOOD  Result Value Ref Range   Specimen Description BLOOD RIGHT ANTECUBITAL    Special Requests      Blood Culture results may not be optimal due to an inadequate volume of blood received in culture bottles BOTTLES DRAWN AEROBIC AND ANAEROBIC Performed at [facility], [address] [place], [place] [address]    Culture PENDING    Report Status PENDING   Lactic acid, plasma     Status: Abnormal   Collection Time: 08/23/20  5:17 PM  Result Value Ref Range   Lactic Acid, Venous 2.4 (HH) 0.5 - 1.9 mmol/L    Comment: CRITICAL RESULT CALLED TO, READ BACK BY AND VERIFIED WITH: WATLINGTON,K ON 08/23/20 AT 1800 BY LOY,C Performed at [facility], [address], [place], [place] [address]     Chemistries  Recent Labs  Lab 08/23/20 1411  NA 135  K 3.4*  CL 100  CO2 26  GLUCOSE 174*  BUN 25*  CREATININE 1.47*  CALCIUM 8.5*  AST 15  ALT 9  ALKPHOS 80  BILITOT 1.2   ------------------------------------------------------------------------------------------------------------------  ------------------------------------------------------------------------------------------------------------------ GFR: CrCl cannot  be calculated (Unknown ideal weight.). Liver Function Tests: Recent Labs  Lab 08/23/20 1411  AST 15  ALT 9  ALKPHOS 80  BILITOT 1.2  PROT 6.8  ALBUMIN 3.4*   No results for input(s): LIPASE, AMYLASE in the last 168 hours. No results for input(s): AMMONIA in the last 168 hours. Coagulation Profile: No results for input(s): INR, PROTIME in the last 168 hours. Cardiac Enzymes: No results for input(s): CKTOTAL, CKMB, CKMBINDEX, TROPONINI in the last 168 hours. BNP (last 3 results) No results for input(s): PROBNP in the last 8760 hours. HbA1C: No results for  input(s): HGBA1C in the last 72 hours. CBG: No results for input(s): GLUCAP in the last 168 hours. Lipid Profile: No results for input(s): CHOL, HDL, LDLCALC, TRIG, CHOLHDL, LDLDIRECT in the last 72 hours. Thyroid Function Tests: No results for input(s): TSH, T4TOTAL, FREET4, T3FREE, THYROIDAB in the last 72 hours. Anemia Panel: No results for input(s): VITAMINB12, FOLATE, FERRITIN, TIBC, IRON, RETICCTPCT in the last 72 hours.  --------------------------------------------------------------------------------------------------------------- Urine analysis:    Component Value Date/Time   COLORURINE YELLOW 08/23/2020 1407   APPEARANCEUR CLOUDY (A) 08/23/2020 1407   LABSPEC 1.020 08/23/2020 1407   PHURINE 5.0 08/23/2020 1407   GLUCOSEU NEGATIVE 08/23/2020 1407   HGBUR SMALL (A) 08/23/2020 1407   BILIRUBINUR NEGATIVE 08/23/2020 1407   KETONESUR 5 (A) 08/23/2020 1407   PROTEINUR 30 (A) 08/23/2020 1407   NITRITE NEGATIVE 08/23/2020 1407   LEUKOCYTESUR NEGATIVE 08/23/2020 1407      Imaging Results:    CT ABDOMEN PELVIS WO CONTRAST  Result Date: 08/23/2020 CLINICAL DATA:  Acute abdominal pain. Diffuse pain with diarrhea. Weakness. EXAM: CT ABDOMEN AND PELVIS WITHOUT CONTRAST TECHNIQUE: Multidetector CT imaging of the abdomen and pelvis was performed following the standard protocol without IV contrast. COMPARISON:  None.  FINDINGS: Lower chest: Subsegmental atelectasis in the lingula. Mild hypoventilatory change in the dependent lungs. No confluent consolidation or pleural fluid. Aortic valvular calcifications. Normal heart size. Hepatobiliary: No focal hepatic lesion on noncontrast exam. Mild gallbladder distension but no calcified gallstone. No pericholecystic inflammation. No biliary dilatation. Pancreas: No ductal dilatation or inflammation. Spleen: Normal in size without focal abnormality. Adrenals/Urinary Tract: Adrenal nodule. Slight adrenal thickening. No hydronephrosis. No perinephric edema. Cyst arising from the upper pole of the left kidney measures 7.4 cm and has thin calcified septations. Cyst arising from the lower left kidney measures 5.7 cm without internal septations. Small low-density lesion arising from the mid right kidney is too small to characterize but likely cyst. Calcification in the right renal hilum is felt to be vascular rather than nonobstructing stone. Urinary bladder is partially distended, elevated by prominently enlarged prostate gland. Stomach/Bowel: Small hiatal hernia. Partially distended stomach. Mild peri pyloric gastric wall thickening, series 2, image 29. No small bowel obstruction or inflammation. There are a few fluid-filled small bowel loops in the pelvis without perienteric inflammation or evidence of wall thickening. Appendix is not definitively seen, no evidence of appendicitis. Nondistended ascending colon which limits assessment for wall thickening. Moderate stool in the transverse, descending, and sigmoid colon. No wall thickening of these colonic segments. Occasional colonic diverticula. There is question of irregular wall thickening involving the right rectosigmoid junction, for example series 2, images 75 and 79. no adjacent inflammation. Vascular/Lymphatic: Moderately advanced aortic atherosclerosis. No aortic aneurysm. Moderate branch atherosclerosis. No enlarged lymph nodes in  the abdomen or pelvis. Reproductive: Prostate gland is prominently enlarged spanning 6.6 x 7.4 x 6.8 cm (volume = 170 cm^3). This causes mass effect on the bladder base. Other: No free air or ascites.  No abdominal wall hernia. Musculoskeletal: Diffuse degenerative change throughout the spine with degenerative disc disease and facet hypertrophy. Degenerative change of the pubic symphysis and both hips. Cortical and trabecular thickening involving the left hemipelvis typical of Paget's disease. There may be some cortical thickening involving the posterior right iliac bone as well. IMPRESSION: 1. Questionable irregular wall thickening involving the right rectosigmoid junction. Recommend correlation with physical and direct visualization/colonoscopy to exclude underlying colonic neoplasm. 2. Colonic diverticulosis without diverticulitis. 3. Mild peri pyloric gastric wall thickening, can be  seen with gastritis or peptic ulcer disease. Small hiatal hernia. 4. Prominently enlarged prostate gland causing mass effect on the bladder base. 5. Left renal cysts, including a minimally complex cyst arising from the upper pole with a calcified septation. 6. Paget's disease of the left hemipelvis. There may be some cortical thickening involving the posterior right iliac bone as well. Aortic Atherosclerosis (ICD10-I70.0). Electronically Signed   By: Narda Rutherford M.D.   On: 08/23/2020 16:57   DG Chest Portable 1 View  Result Date: 08/23/2020 CLINICAL DATA:  Weakness, chills and diarrhea beginning yesterday. EXAM: PORTABLE CHEST 1 VIEW COMPARISON:  None. FINDINGS: The lungs are clear. Heart size is normal. Aortic atherosclerosis. No pneumothorax or pleural fluid. No acute or focal bony abnormality. IMPRESSION: No acute disease. Aortic Atherosclerosis (ICD10-I70.0). Electronically Signed   By: Drusilla Kanner M.D.   On: 08/23/2020 15:02    My personal review of EKG: Rhythm sinus tach with a rate of 108   Assessment &  Plan:    Principal Problem:   SIRS (systemic inflammatory response syndrome) (HCC) Active Problems:   AKI (acute kidney injury) (HCC)   Diabetes mellitus type 2 in obese (HCC)   Lactic acidosis   Paget's disease of bone   1. SIRS criteria positive 1. Patient has a heart rate of 120, respiratory rate of 27, lactate 24, white blood cell count 14.0, AKI 2. No source identified yet, suspect intra-abdominal source 3. If lactic acid continues to increase-ischemic bowel may be on the differential and contrast CT might be required 4. Broad-spectrum antibiotics started 5. Blood pressure maintained at 129/67, 1 L bolus given in the ED, continue fluids at 125 mils per hour 6. Blood cultures pending 7. UA not indicative of UTI, but since no source has been identified and abnormality of bladder on CT, urine culture pending 8. Continue Tylenol.  Fever 9. Chest x-ray shows no acute disease 10. CT scan does not show evidence of an infection 11. Continue to monitor 2. AKI 1. Baseline creatinine seems to be around 1.2, creatinine today 1.47 2. Volume down status in the setting of likely sepsis 3. 1 L bolus in the ED, continue fluids as above 4. Hold nephrotoxic agents when possible including Hyzaar 5. Continue to monitor 3. Lactic acidosis 1. Continue to trend lactic acid 2. See plan above 3. Continue to monitor 4. Paget's disease of bone 1. May require Ortho consult in or outpatient 2. With pain on CT scan 3. Continue to monitor 5. Diabetes mellitus type 2 1. Hemoglobin A1c pending 2. Hold oral hypoglycemics 3. Continue sliding scale 6.    DVT Prophylaxis-   Heparin- SCDs  AM Labs Ordered, also please review Full Orders  Family Communication: Daughter Code Status: Full Admission status: Inpatient :The appropriate admission status for this patient is INPATIENT. Inpatient status is judged to be reasonable and necessary in order to provide the required intensity of service to ensure the  patient's safety. The patient's presenting symptoms, physical exam findings, and initial radiographic and laboratory data in the context of their chronic comorbidities is felt to place them at high risk for further clinical deterioration. Furthermore, it is not anticipated that the patient will be medically stable for discharge from the hospital within 2 midnights of admission. The following factors support the admission status of inpatient.     The patient's presenting symptoms include fever and chills. The worrisome physical exam findings include tachycardia The initial radiographic and laboratory data are worrisome because of AKI and  lactic acidosis The chronic co-morbidities include diabetes mellitus type 2       * I certify that at the point of admission it is my clinical judgment that the patient will require inpatient hospital care spanning beyond 2 midnights from the point of admission due to high intensity of service, high risk for further deterioration and high frequency of surveillance required.*  Time spent in minutes : 65   Evagelia Knack B Zierle-Ghosh DO

## 2020-08-23 NOTE — ED Notes (Signed)
Pt's daughter reports she wants to sign patient out against medical advice (ama). EDP made aware, will see patient and daughter.

## 2020-08-24 ENCOUNTER — Encounter (HOSPITAL_COMMUNITY): Payer: Self-pay | Admitting: Family Medicine

## 2020-08-24 DIAGNOSIS — E119 Type 2 diabetes mellitus without complications: Secondary | ICD-10-CM

## 2020-08-24 DIAGNOSIS — N179 Acute kidney failure, unspecified: Principal | ICD-10-CM

## 2020-08-24 DIAGNOSIS — E872 Acidosis: Secondary | ICD-10-CM

## 2020-08-24 DIAGNOSIS — R6510 Systemic inflammatory response syndrome (SIRS) of non-infectious origin without acute organ dysfunction: Secondary | ICD-10-CM

## 2020-08-24 DIAGNOSIS — E669 Obesity, unspecified: Secondary | ICD-10-CM

## 2020-08-24 DIAGNOSIS — M889 Osteitis deformans of unspecified bone: Secondary | ICD-10-CM

## 2020-08-24 LAB — COMPREHENSIVE METABOLIC PANEL
ALT: 10 U/L (ref 0–44)
AST: 21 U/L (ref 15–41)
Albumin: 2.8 g/dL — ABNORMAL LOW (ref 3.5–5.0)
Alkaline Phosphatase: 69 U/L (ref 38–126)
Anion gap: 10 (ref 5–15)
BUN: 19 mg/dL (ref 8–23)
CO2: 23 mmol/L (ref 22–32)
Calcium: 8 mg/dL — ABNORMAL LOW (ref 8.9–10.3)
Chloride: 104 mmol/L (ref 98–111)
Creatinine, Ser: 1.17 mg/dL (ref 0.61–1.24)
GFR, Estimated: 60 mL/min — ABNORMAL LOW (ref >60)
Glucose, Bld: 133 mg/dL — ABNORMAL HIGH (ref 70–99)
Potassium: 3.1 mmol/L — ABNORMAL LOW (ref 3.5–5.1)
Sodium: 137 mmol/L (ref 135–145)
Total Bilirubin: 1.2 mg/dL (ref 0.3–1.2)
Total Protein: 5.6 g/dL — ABNORMAL LOW (ref 6.5–8.1)

## 2020-08-24 LAB — CBC
HCT: 38.5 % — ABNORMAL LOW (ref 39.0–52.0)
Hemoglobin: 12.3 g/dL — ABNORMAL LOW (ref 13.0–17.0)
MCH: 32.5 pg (ref 26.0–34.0)
MCHC: 31.9 g/dL (ref 30.0–36.0)
MCV: 101.9 fL — ABNORMAL HIGH (ref 80.0–100.0)
Platelets: 166 10*3/uL (ref 150–400)
RBC: 3.78 MIL/uL — ABNORMAL LOW (ref 4.22–5.81)
RDW: 13.1 % (ref 11.5–15.5)
WBC: 16.2 10*3/uL — ABNORMAL HIGH (ref 4.0–10.5)
nRBC: 0 % (ref 0.0–0.2)

## 2020-08-24 LAB — PROTIME-INR
INR: 1.5 — ABNORMAL HIGH (ref 0.8–1.2)
Prothrombin Time: 17.5 seconds — ABNORMAL HIGH (ref 11.4–15.2)

## 2020-08-24 LAB — GLUCOSE, CAPILLARY
Glucose-Capillary: 127 mg/dL — ABNORMAL HIGH (ref 70–99)
Glucose-Capillary: 124 mg/dL — ABNORMAL HIGH (ref 70–99)
Glucose-Capillary: 138 mg/dL — ABNORMAL HIGH (ref 70–99)

## 2020-08-24 LAB — C DIFFICILE QUICK SCREEN W PCR REFLEX
C Diff antigen: NEGATIVE
C Diff interpretation: NOT DETECTED
C Diff toxin: NEGATIVE

## 2020-08-24 LAB — LACTIC ACID, PLASMA
Lactic Acid, Venous: 1.7 mmol/L (ref 0.5–1.9)
Lactic Acid, Venous: 2.9 mmol/L (ref 0.5–1.9)

## 2020-08-24 LAB — CORTISOL-AM, BLOOD: Cortisol - AM: 38.1 ug/dL — ABNORMAL HIGH (ref 6.7–22.6)

## 2020-08-24 LAB — HEMOGLOBIN A1C
Hgb A1c MFr Bld: 6 % — ABNORMAL HIGH (ref 4.8–5.6)
Mean Plasma Glucose: 125.5 mg/dL

## 2020-08-24 LAB — MAGNESIUM: Magnesium: 1.4 mg/dL — ABNORMAL LOW (ref 1.7–2.4)

## 2020-08-24 LAB — TSH: TSH: 1.043 u[IU]/mL (ref 0.350–4.500)

## 2020-08-24 LAB — PROCALCITONIN: Procalcitonin: 0.78 ng/mL

## 2020-08-24 MED ORDER — POTASSIUM CHLORIDE 20 MEQ PO PACK
40.0000 meq | PACK | Freq: Once | ORAL | Status: AC
  Administered 2020-08-24: 40 meq via ORAL
  Filled 2020-08-24: qty 2

## 2020-08-24 MED ORDER — LACTATED RINGERS IV SOLN: INTRAVENOUS | Status: DC

## 2020-08-24 MED ORDER — BACID PO TABS
1.0000 | ORAL_TABLET | Freq: Three times a day (TID) | ORAL | Status: DC
  Administered 2020-08-24 – 2020-08-27 (×10): 1 via ORAL
  Filled 2020-08-24 (×16): qty 1

## 2020-08-24 MED ORDER — MAGNESIUM SULFATE 2 GM/50ML IV SOLN
2.0000 g | Freq: Once | INTRAVENOUS | Status: AC
  Administered 2020-08-24: 2 g via INTRAVENOUS
  Filled 2020-08-24: qty 50

## 2020-08-24 NOTE — Evaluation (Signed)
Occupational Therapy Evaluation Patient Details Name: Louis Henderson MRN: [id number] DOB: 11-18-1928 Today's Date: 08/24/2020    History of Present Illness Louis Henderson  is a 87 y.o. male, with hx HTN and DMII with a chief complaint of fever. Patient asks that I take history from his daughter.  Daughter reports that patient is here today because he was shivering all through the night.  She reports that he had a low-grade fever of 99.8 at home.  She reports that he states "that she took extra blankets" and got in bed with him overnight, but he was still not warm.  She reports that he did not stop shivering until about 1 AM.  Daughter reports that patient is not normally incontinent of his bowels but he was last night.  She does report that he has been having diarrhea for several weeks, but she cannot member how many weeks.  She reports watery-like stools 3 or 4 times a day.  She says sometimes they are just "squirts."  She has not seen any blood or melena.  She reports no nausea or vomiting.  Daughter reports that patient always has a cough that productive of white sputum, but his cough has not changed.  He has not complained of chest pain, dysuria, sores or rashes on his body.  Patient has had a decreased appetite.  She reports that he normally eats small servings, but today he did not eat at all.  She does report that he has had darker urine than normal as well.  Lastly she reports that he has had generalized weakness.  Normally daughter is able to help patient transfer from bed or chair to wheelchair, but he has been so weak that she has not been able to help him do that today.  Patient has poor insight into his health.  Continues to try to leave AGAINST MEDICAL ADVICE, requiring multiple providers to explain to him again why it is best for him to stay in the ED.  With that explanation patient does agree to stay.   Clinical Impression   Pt agreeable to OT/PT co-evaluation. Pt required moderate  assist for supine to sit bed mobility and maximal assist for sit to stand and stand pivot transfer from EOB to bedside commode and commode to chair. +2 assist for safety of pt during stand pivot transfers. Total assist for toilet hygiene this date. Pt demonstrated limited A/ROM for bilateral shoulder flexion to ~90* due to bilateral shoulder arthritis per pt report. Pt family present in the room and reported they would be able to meet assist level needs at home with home health. Pt would benefit from rehab at a SNF facility prior to d/c home. Pt will benefit from continued OT services at the hospital setting to increase strength, endurance, ROM, and balance for overall increase of ADL independence prior to d/c.     Follow Up Recommendations  SNF    Equipment Recommendations  3 in 1 bedside commode (Recommended due to old bedside commode at home.)           Precautions / Restrictions Precautions Precautions: Fall Restrictions Weight Bearing Restrictions: No      Mobility Bed Mobility Overal bed mobility: Needs Assistance Bed Mobility: Supine to Sit     Supine to sit: Mod assist     General bed mobility comments: slow labored movement    Transfers Overall transfer level: Needs assistance Equipment used: Rolling walker (2 wheeled) Transfers: Sit to/from [employer] Sit to Stand: Max  assist Stand pivot transfers: Max assist       General transfer comment: EOB to bedside commode; bedside commode to chair; Max A using RW;    Balance Overall balance assessment: Needs assistance Sitting-balance support: Feet supported;No upper extremity supported Sitting balance-Leahy Scale: Poor (Poor to fair) Sitting balance - Comments: L side lean if L UE not supporting Postural control: Left lateral lean Standing balance support: During functional activity;Bilateral upper extremity supported Standing balance-Leahy Scale: Poor Standing balance comment: Poor with RW                            ADL either performed or assessed with clinical judgement   ADL Overall ADL's : Needs assistance/impaired                         Toilet Transfer: Maximal assistance;Stand-pivot;RW;+2 for physical assistance Toilet Transfer Details (indicate cue type and reason): PT provided Max assist for stand pivot transfer with OT providing min guard to min assist as well. Toileting- Clothing Manipulation and Hygiene: Total assistance;Sit to/from stand Toileting - Clothing Manipulation Details (indicate cue type and reason): Assist for bowel hygiene while standing at edge of bedside commode with PT assist for standing.       General ADL Comments: Pt noted to be weak in LE and require max assist for and standing tasks.     Vision Baseline Vision/History: Wears glasses Vision Assessment?: No apparent visual deficits                Pertinent Vitals/Pain Pain Assessment: Faces Faces Pain Scale: Hurts a little bit Pain Location: Bilateral lower feet Pain Descriptors / Indicators: Grimacing Pain Intervention(s): Monitored during session     Hand Dominance Right   Extremity/Trunk Assessment Upper Extremity Assessment Upper Extremity Assessment: RUE deficits/detail;LUE deficits/detail;Generalized weakness RUE Deficits / Details: Limited shoulder flexion A/ROM to ~90 to 100* LUE Deficits / Details: Limited shoulder flexion A/ROM to ~90 to 100* Reportedly from shoulder arthritis   Lower Extremity Assessment Lower Extremity Assessment: Defer to PT evaluation   Cervical / Trunk Assessment Cervical / Trunk Assessment: Kyphotic (possibly slight kyphotic; anterior lean)   Communication Communication Communication: No difficulties   Cognition Arousal/Alertness: Awake/alert Behavior During Therapy: WFL for tasks assessed/performed Overall Cognitive Status: Within Functional Limits for tasks assessed                                                       Home Living Family/patient expects to be discharged to:: Private residence Living Arrangements: Children Available Help at Discharge: Family;Available 24 hours/day Type of Home: House Home Access: Ramped entrance     Home Layout: One level;Laundry or work area in basement;Other (Comment) (Patient does not go into basement) Alternate Level Stairs-Number of Steps:  (basement level; pt does not go down those stairs)   Bathroom Shower/Tub: Chief Strategy Officer: Standard Bathroom Accessibility: Yes   Home Equipment: Walker - 2 wheels;Wheelchair - manual;Shower seat   Additional Comments: Family stated they have a bedside commode but it is old.      Prior Functioning/Environment Level of Independence: Needs assistance  Gait / Transfers Assistance Needed: short distanced household ambulator with assist, assisted for transfers, uses w/c for mobility ADL's / Homemaking Assistance Needed:  assisted by family            OT Problem List: Decreased strength;Decreased range of motion;Decreased activity tolerance;Impaired balance (sitting and/or standing)      OT Treatment/Interventions: Self-care/ADL training;Therapeutic exercise;DME and/or AE instruction;Therapeutic activities;Patient/family education;Balance training    OT Goals(Current goals can be found in the care plan section) Acute Rehab OT Goals Patient Stated Goal: return home OT Goal Formulation: With patient Time For Goal Achievement: 09/07/20 Potential to Achieve Goals: Fair  OT Frequency: Min 2X/week               Co-evaluation PT/OT/SLP Co-Evaluation/Treatment: Yes Reason for Co-Treatment: Complexity of the patient's impairments (multi-system involvement);For patient/therapist safety PT goals addressed during session: Mobility/safety with mobility;Proper use of DME;Balance OT goals addressed during session: ADL's and self-care                       End of Session  Equipment Utilized During Treatment: Rolling walker  Activity Tolerance: Patient tolerated treatment well Patient left: in chair;with call bell/phone within reach;with chair alarm set;with family/visitor present  OT Visit Diagnosis: Unsteadiness on feet (R26.81);Muscle weakness (generalized) (M62.81)                Time: 8416-6063 OT Time Calculation (min): 35 min Charges:  OT General Charges $OT Visit: 1 Visit OT Evaluation $OT Eval Moderate Complexity: 1 Mod  Kailen Name OT, MOT   [employer] 08/24/2020, 10:22 AM

## 2020-08-24 NOTE — Progress Notes (Signed)
   08/24/20 0800  Assess: MEWS Score  Temp 98.4 F (36.9 C)  BP (!) 150/79  Pulse Rate (!) 117  Resp 20  Level of Consciousness Alert  SpO2 98 %  O2 Device Room Air  Assess: MEWS Score  MEWS Temp 0  MEWS Systolic 0  MEWS Pulse 2  MEWS RR 0  MEWS LOC 0  MEWS Score 2  MEWS Score Color Yellow  Assess: if the MEWS score is Yellow or Red  Were vital signs taken at a resting state? Yes  Focused Assessment No change from prior assessment  Early Detection of Sepsis Score *See Row Information* Low  MEWS guidelines implemented *See Row Information* No, previously red, continue vital signs every 4 hours  Treat  MEWS Interventions Escalated (See documentation below)  Pain Scale 0-10  Pain Score 0  Escalate  MEWS: Escalate Yellow: discuss with charge nurse/RN and consider discussing with provider and RRT  Notify: Charge Nurse/RN  Name of Charge Nurse/RN Notified Tonna Boehringer RN  Date Charge Nurse/RN Notified 08/24/20  Time Charge Nurse/RN Notified [phone]

## 2020-08-24 NOTE — Plan of Care (Signed)
  Problem: Acute Rehab OT Goals (only OT should resolve) Goal: Pt. Will Perform Grooming Flowsheets (Taken 08/24/2020 1212) Pt Will Perform Grooming:  sitting  with set-up Goal: Pt. Will Perform Upper Body Dressing Flowsheets (Taken 08/24/2020 1212) Pt Will Perform Upper Body Dressing:  sitting  with modified independence Goal: Pt. Will Perform Lower Body Dressing Flowsheets (Taken 08/24/2020 1212) Pt Will Perform Lower Body Dressing:  with mod assist  sitting/lateral leans  bed level Goal: Pt. Will Transfer To Toilet Flowsheets (Taken 08/24/2020 1212) Pt Will Transfer to Toilet:  with min assist  stand pivot transfer  grab bars Goal: Pt/Caregiver Will Perform Home Exercise Program Flowsheets (Taken 08/24/2020 1212) Pt/caregiver will Perform Home Exercise Program:  Increased strength  Both right and left upper extremity  [place]grebe OT, MOT

## 2020-08-24 NOTE — Progress Notes (Signed)
MD and charge nurse notified of MEWs score of 4. Vitals are as followed B/P 145/80 HR 126 RR 22 oxygen on room air 96% and temp 99.7. No new orders obtained will continue to monitor

## 2020-08-24 NOTE — Progress Notes (Signed)
PROGRESS NOTE    Patient: Louis Henderson                            PCP: Garald Braver                    DOB: 1929-02-22            DOA: 08/23/2020 [id number]             DOS: 08/24/2020, 7:59 AM   LOS: 1 day   Date of Service: The patient was seen and examined on 08/24/2020  Subjective:   The patient was seen and examined this morning. Awake alert, hemodynamically stable with exception of tachycardia heart rate 120s, otherwise stable Otherwise no issues overnight . Daughter present at bedside, reporting one loose stool this morning  Brief Narrative:   Louis Henderson  is a 87 y.o. male, with hx HTN and DMII with a chief complaint of fever/chills overnight T-max at home 99.8.    Daughter reports that patient is not normally incontinent of his bowels but he was last night.  She does report that he has been having diarrhea for several weeks, but she cannot remember how many times in the past week.  She reports watery-like stools 3 or 4 times a day.  She says sometimes they are just "squirts."  She has not seen any blood or melena.  Also reported chronic cough, with no changes.  Patient has had a decreased appetite.  She reports that he normally eats small servings, but today he did not eat at all.  She does report that he has had darker urine than normal as well.  Lastly she reports that he has had generalized weakness.   Patient has poor insight into his health.   Continues to try to leave AGAINST MEDICAL ADVICE, requiring multiple providers to explain to him again why it is best for him to stay.   In the ED Temp 99.7, but goes as high as 102.2, heart rate 100-122, respiratory rate 20-23, blood pressure 129/67, satting at 99% Lactic acid increasing from 1.8- 2.4 CT abdomen shows wall thickening right rectosigmoid junction.  Also shows colonic diverticulosis without diverticulitis.  Mild peripyloric gastric wall thickening that can be seen with gastritis or peptic ulcer  disease.  Small hiatal hernia.  Prominently enlarged prostate gland causing mass-effect on the bladder base.  Left renal cyst including minimally complex cyst arising from the upper pole with a calcified septation.  Paget's disease of the left hemipelvis. White blood cell count 14,000, hemoglobin 12.7 Minimal hypokalemia at 3.4 Blood cultures pending Chemistry panel reveals a AKI with a creatinine baseline of 1.2, creatinine today 1.47 Hyperglycemia in the setting of diabetes mellitus type 2 at 174 UA was not suspicious for UTI EKG shows a heart rate of 108, sinus tach   Assessment & Plan:   Principal Problem:   SIRS (systemic inflammatory response syndrome) (HCC) Active Problems:   AKI (acute kidney injury) (HCC)   Diabetes mellitus type 2 in obese (HCC)   Lactic acidosis   Paget's disease of bone    SIRS criteria positive 1. On admission: patient has a heart rate of 120, respiratory rate of 27, lactate 2.4, white blood cell count 14.0, AKI 2. No source identified yet, suspect intra-abdominal source 3. Lactic acid 2.4, 1.7, this morning CT abdomen pelvis reviewed: Irregular wall thickening, ?  Enteritis, colonic diverticulosis without diverticulitis,?  Bilateral gastritis.Marland KitchenMarland Kitchen  Enlarged prostate with mass-effect, left renal cyst, Paget disease of the left hemipelvis  4. Broad-spectrum antibiotics started 5. Blood pressure maintained at 129/67, 1 L bolus given in the ED,cont IVF changed to LR  6. Blood cultures >>>  7. Loose stool present on admission, ordering C. difficile -to rule out C. difficile colitis 8. UA not indicative of UTI, but since no source has been identified and abnormality of bladder on CT, urine culture >>>  9. Continue Tylenol.  Fever 10. Chest x-ray shows no acute disease 11. CT scan does not show evidence of an infection 12. Continue to monitor  AKI -acute kidney injury 1. Baseline creatinine seems to be around 1.2, creatinine today 1.47 2. Volume down  status in the setting of likely sepsis 3. 1 L bolus in the ED, continue fluids  4. Hold nephrotoxic agents when possible including Hyzaar 5. Continue to monitor  Lactic acidosis 1. Continue to trend lactic acid 2.4 >>1.8. 1.7 >>> 2.9  now 2. Continue to monitor  Paget's disease of bone 1. May require Ortho consult as outpatient -no acute intervention recommended with his advanced age and comorbidities 2. With pain on CT scan 3. Continue to monitor  Diabetes mellitus type 2 1. Hemoglobin A1c  2. Hold oral hypoglycemics 3. Checking his blood sugar QA CHS, with SSI coverage        ----------------------------------------------------------------------------------------------------------------------------------------------- Nutritional status:  The patient's BMI is: Body mass index is 34.09 kg/m. I agree with the assessment and plan as outlined  Skin Assessment: I have examined the patient's skin and I agree with the wound assessment as performed by wound care team As outlined ---------------------------------------------------------------------------------------------------------------------------------------------------- Cultures; Blood Cultures x 2 >> NGT Urine Culture  >>> NGT    Antimicrobials: IV antibiotics of cefepime/vancomycin/Flagyl   Consultants: None   ---------------------------------------------------------------------------------------------------------------------------------------------  DVT prophylaxis:  SCD/Compression stockings and Heparin SQ Code Status:   Code Status: Full Code  Family Communication: Daughter updated at bedside The above findings and plan of care has been discussed with patient (daughter)  in detail,  they expressed understanding and agreement of above. -Advance care planning has been discussed.   Admission status:   Status is: Inpatient  Remains inpatient appropriate because:Inpatient level of care appropriate due to  severity of illness   Dispo: The patient is from: Home              Anticipated d/c is to: Home with home health              Patient currently is not medically stable to d/c.   Difficult to place patient No      Level of care: Med-Surg   Procedures:   No admission procedures for hospital encounter.     Antimicrobials:  Anti-infectives (From admission, onward)   Start     Dose/Rate Route Frequency Ordered Stop   08/24/20 2200  vancomycin (VANCOREADY) IVPB 750 mg/150 mL        750 mg 150 mL/hr over 60 Minutes Intravenous Every 24 hours 08/23/20 2142     08/24/20 1000  ceFEPIme (MAXIPIME) 2 g in sodium chloride 0.9 % 100 mL IVPB        2 g 200 mL/hr over 30 Minutes Intravenous Every 12 hours 08/23/20 2122     08/24/20 0100  metroNIDAZOLE (FLAGYL) IVPB 500 mg        500 mg 100 mL/hr over 60 Minutes Intravenous Every 8 hours 08/23/20 2352     08/24/20 0045  ceFEPIme (MAXIPIME) 2  g in sodium chloride 0.9 % 100 mL IVPB  Status:  Discontinued        2 g 200 mL/hr over 30 Minutes Intravenous  Once 08/23/20 2352 08/23/20 2354   08/24/20 0045  vancomycin (VANCOCIN) IVPB 1000 mg/200 mL premix  Status:  Discontinued        1,000 mg 200 mL/hr over 60 Minutes Intravenous  Once 08/23/20 2352 08/23/20 2354   08/23/20 2215  vancomycin (VANCOREADY) IVPB 750 mg/150 mL       "And" Linked Group Details   750 mg 150 mL/hr over 60 Minutes Intravenous  Once 08/23/20 2103 08/23/20 2318   08/23/20 2115  vancomycin (VANCOCIN) IVPB 1000 mg/200 mL premix       "And" Linked Group Details   1,000 mg 200 mL/hr over 60 Minutes Intravenous  Once 08/23/20 2103 08/23/20 2314   08/23/20 2100  vancomycin (VANCOREADY) IVPB 1750 mg/350 mL  Status:  Discontinued        1,750 mg 175 mL/hr over 120 Minutes Intravenous  Once 08/23/20 2052 08/23/20 2104   08/23/20 2015  ceFEPIme (MAXIPIME) 2 g in sodium chloride 0.9 % 100 mL IVPB        2 g 200 mL/hr over 30 Minutes Intravenous  Once 08/23/20 2004 08/23/20  2121       Medication:  . amLODipine  5 mg Oral Daily  . aspirin  81 mg Oral Daily  . carvedilol  3.125 mg Oral Q breakfast  . famotidine  40 mg Oral Daily  . heparin  5,000 Units Subcutaneous Q8H  . insulin aspart  0-9 Units Subcutaneous TID WC  . potassium chloride  40 mEq Oral Once  . simvastatin  20 mg Oral q1800  . traZODone  50 mg Oral QHS    acetaminophen **OR** acetaminophen, ondansetron **OR** ondansetron (ZOFRAN) IV, oxyCODONE   Objective:   Vitals:   08/23/20 2357 08/24/20 0100 08/24/20 0151 08/24/20 0300  BP: (!) 145/80 121/64 (!) 143/78 (!) 138/92  Pulse: (!) 126 (!) 122 (!) 118 (!) 120  Resp: (!) 22 (!) Temp: 99.7 F (37.6 C) 99.4 F (37.4 C) 98.4 F (36.9 C) 100.2 F (37.9 C)  TempSrc:  Oral  Oral  SpO2: 96% 95% 97% 97%  Weight:      Height:        Intake/Output Summary (Last 24 hours) at 08/24/2020 0759 Last data filed at 08/24/2020 0500 Gross per 24 hour  Intake 80 ml  Output 355 ml  Net -275 ml   Filed Weights   08/23/20 2044  Weight: 87.3 kg     Examination:   Physical Exam  Constitution:  Alert, cooperative, no distress,  Appears calm and comfortable  Psychiatric: Normal and stable mood and affect, cognition intact,   HEENT: Normocephalic, PERRL, otherwise with in Normal limits  Chest:Chest symmetric Cardio vascular:  S1/S2, RRR, No murmure, No Rubs or Gallops  pulmonary: Clear to auscultation bilaterally, respirations unlabored, negative wheezes / crackles Abdomen: Soft, non-tender, non-distended, bowel sounds,no masses, no organomegaly Muscular skeletal:  Severe generalized weaknesses Limited exam - in bed, able to move all 4 extremities, Normal strength,  Neuro: CNII-XII intact. , normal motor and sensation, reflexes intact  Extremities: No pitting edema lower extremities, +2 pulses  Skin: Dry, warm to touch, negative for any Rashes, No open wounds Wounds: per nursing documentation     ------------------------------------------------------------------------------------------------------------------------------------------    LABs:  CBC Latest Ref Rng & Units 08/23/2020  WBC 4.0 - 10.5  K/uL 14.1(H)  Hemoglobin 13.0 - 17.0 g/dL 12.7(L)  Hematocrit 39.0 - 52.0 % 38.6(L)  Platelets 150 - 400 K/uL 200   CMP Latest Ref Rng & Units 08/24/2020 08/23/2020  Glucose 70 - 99 mg/dL 700(F) 749(S)  BUN 8 - 23 mg/dL 19 49(Q)  Creatinine 7.59 - 1.24 mg/dL 1.63 8.46(K)  Sodium 599 - 145 mmol/L 137 135  Potassium 3.5 - 5.1 mmol/L 3.1(L) 3.4(L)  Chloride 98 - 111 mmol/L 104 100  CO2 22 - 32 mmol/L 23 26  Calcium 8.9 - 10.3 mg/dL 8.0(L) 8.5(L)  Total Protein 6.5 - 8.1 g/dL 3.5(T) 6.8  Total Bilirubin 0.3 - 1.2 mg/dL 1.2 1.2  Alkaline Phos 38 - 126 U/L 69 80  AST 15 - 41 U/L 21 15  ALT 0 - 44 U/L 10 9       Micro Results Recent Results (from the past 240 hour(s))  Resp Panel by RT-PCR (Flu A&B, Covid) Nasopharyngeal Swab     Status: None   Collection Time: 08/23/20  2:11 PM   Specimen: Nasopharyngeal Swab; Nasopharyngeal(NP) swabs in vial transport medium  Result Value Ref Range Status   SARS Coronavirus 2 by RT PCR NEGATIVE NEGATIVE Final    Comment: (NOTE) SARS-CoV-2 target nucleic acids are NOT DETECTED.  The SARS-CoV-2 RNA is generally detectable in upper respiratory specimens during the acute phase of infection. The lowest concentration of SARS-CoV-2 viral copies this assay can detect is 138 copies/mL. A negative result does not preclude SARS-Cov-2 infection and should not be used as the sole basis for treatment or other patient management decisions. A negative result may occur with  improper specimen collection/handling, submission of specimen other than nasopharyngeal swab, presence of viral mutation(s) within the areas targeted by this assay, and inadequate number of viral copies(<138 copies/mL). A negative result must be combined with clinical observations,  patient history, and epidemiological information. The expected result is Negative.  Fact Sheet for Patients:  BloggerCourse.com  Fact Sheet for Healthcare Providers:  SeriousBroker.it  This test is no t yet approved or cleared by the [place] FDA and  has been authorized for detection and/or diagnosis of SARS-CoV-2 by FDA under an Emergency Use Authorization (EUA). This EUA will remain  in effect (meaning this test can be used) for the duration of the COVID-19 declaration under Section 564(b)(1) of the Act, 21 U.S.C.section 360bbb-3(b)(1), unless the authorization is terminated  or revoked sooner.       Influenza A by PCR NEGATIVE NEGATIVE Final   Influenza B by PCR NEGATIVE NEGATIVE Final    Comment: (NOTE) The Xpert Xpress SARS-CoV-2/FLU/RSV plus assay is intended as an aid in the diagnosis of influenza from Nasopharyngeal swab specimens and should not be used as a sole basis for treatment. Nasal washings and aspirates are unacceptable for Xpert Xpress SARS-CoV-2/FLU/RSV testing.  Fact Sheet for Patients: BloggerCourse.com  Fact Sheet for Healthcare Providers: SeriousBroker.it  This test is not yet approved or cleared by the [place] FDA and has been authorized for detection and/or diagnosis of SARS-CoV-2 by FDA under an Emergency Use Authorization (EUA). This EUA will remain in effect (meaning this test can be used) for the duration of the COVID-19 declaration under Section 564(b)(1) of the Act, 21 U.S.C. section 360bbb-3(b)(1), unless the authorization is terminated or revoked.  Performed at [facility], [address], [place], [place] [address]   Culture, blood (routine x 2)     Status: None (Preliminary result)   Collection Time: 08/23/20  3:33  PM   Specimen: BLOOD  Result Value Ref Range Status   Specimen Description BLOOD BLOOD RIGHT HAND  Final    Special Requests   Final    Blood Culture results may not be optimal due to an inadequate volume of blood received in culture bottles BOTTLES DRAWN AEROBIC AND ANAEROBIC   Culture   Final    NO GROWTH < 24 HOURS Performed at [facility], [address] [place], [place] [address]    Report Status PENDING  Incomplete  Culture, blood (routine x 2)     Status: None (Preliminary result)   Collection Time: 08/23/20  3:33 PM   Specimen: BLOOD  Result Value Ref Range Status   Specimen Description BLOOD RIGHT ANTECUBITAL  Final   Special Requests   Final    Blood Culture results may not be optimal due to an inadequate volume of blood received in culture bottles BOTTLES DRAWN AEROBIC AND ANAEROBIC   Culture  Setup Time   Final    ANAEROBIC BOTTLE ONLY GRAM POSITIVE RODS Gram Stain Report Called to,Read Back By and Verified With: P BROWM,[id number]  08/24/20 MKELLY    Culture   Final    NO GROWTH < 24 HOURS Performed at [facility], [address] [place], [place] [address]    Report Status PENDING  Incomplete    Radiology Reports CT ABDOMEN PELVIS WO CONTRAST  Result Date: 08/23/2020 CLINICAL DATA:  Acute abdominal pain. Diffuse pain with diarrhea. Weakness. EXAM: CT ABDOMEN AND PELVIS WITHOUT CONTRAST TECHNIQUE: Multidetector CT imaging of the abdomen and pelvis was performed following the standard protocol without IV contrast. COMPARISON:  None. FINDINGS: Lower chest: Subsegmental atelectasis in the lingula. Mild hypoventilatory change in the dependent lungs. No confluent consolidation or pleural fluid. Aortic valvular calcifications. Normal heart size. Hepatobiliary: No focal hepatic lesion on noncontrast exam. Mild gallbladder distension but no calcified gallstone. No pericholecystic inflammation. No biliary dilatation. Pancreas: No ductal dilatation or inflammation. Spleen: Normal in size without focal abnormality. Adrenals/Urinary Tract: Adrenal nodule. Slight adrenal thickening. No  hydronephrosis. No perinephric edema. Cyst arising from the upper pole of the left kidney measures 7.4 cm and has thin calcified septations. Cyst arising from the lower left kidney measures 5.7 cm without internal septations. Small low-density lesion arising from the mid right kidney is too small to characterize but likely cyst. Calcification in the right renal hilum is felt to be vascular rather than nonobstructing stone. Urinary bladder is partially distended, elevated by prominently enlarged prostate gland. Stomach/Bowel: Small hiatal hernia. Partially distended stomach. Mild peri pyloric gastric wall thickening, series 2, image 29. No small bowel obstruction or inflammation. There are a few fluid-filled small bowel loops in the pelvis without perienteric inflammation or evidence of wall thickening. Appendix is not definitively seen, no evidence of appendicitis. Nondistended ascending colon which limits assessment for wall thickening. Moderate stool in the transverse, descending, and sigmoid colon. No wall thickening of these colonic segments. Occasional colonic diverticula. There is question of irregular wall thickening involving the right rectosigmoid junction, for example series 2, images 75 and 79. no adjacent inflammation. Vascular/Lymphatic: Moderately advanced aortic atherosclerosis. No aortic aneurysm. Moderate branch atherosclerosis. No enlarged lymph nodes in the abdomen or pelvis. Reproductive: Prostate gland is prominently enlarged spanning 6.6 x 7.4 x 6.8 cm (volume = 170 cm^3). This causes mass effect on the bladder base. Other: No free air or ascites.  No abdominal wall hernia. Musculoskeletal: Diffuse degenerative change throughout the spine with degenerative disc disease and facet  hypertrophy. Degenerative change of the pubic symphysis and both hips. Cortical and trabecular thickening involving the left hemipelvis typical of Paget's disease. There may be some cortical thickening involving the  posterior right iliac bone as well. IMPRESSION: 1. Questionable irregular wall thickening involving the right rectosigmoid junction. Recommend correlation with physical and direct visualization/colonoscopy to exclude underlying colonic neoplasm. 2. Colonic diverticulosis without diverticulitis. 3. Mild peri pyloric gastric wall thickening, can be seen with gastritis or peptic ulcer disease. Small hiatal hernia. 4. Prominently enlarged prostate gland causing mass effect on the bladder base. 5. Left renal cysts, including a minimally complex cyst arising from the upper pole with a calcified septation. 6. Paget's disease of the left hemipelvis. There may be some cortical thickening involving the posterior right iliac bone as well. Aortic Atherosclerosis (ICD10-I70.0). Electronically Signed   By: Narda Rutherford M.D.   On: 08/23/2020 16:57   DG Chest Portable 1 View  Result Date: 08/23/2020 CLINICAL DATA:  Weakness, chills and diarrhea beginning yesterday. EXAM: PORTABLE CHEST 1 VIEW COMPARISON:  None. FINDINGS: The lungs are clear. Heart size is normal. Aortic atherosclerosis. No pneumothorax or pleural fluid. No acute or focal bony abnormality. IMPRESSION: No acute disease. Aortic Atherosclerosis (ICD10-I70.0). Electronically Signed   By: Drusilla Kanner M.D.   On: 08/23/2020 15:02    SIGNED: Kendell Bane, MD, FHM. [facility],  Pager (please use [email or web address] to page/text) Please use Epic Secure Chat for non-urgent communication (7AM-7PM)  If 7PM-7AM, please contact night-coverage [email or web address], 08/24/2020, 7:59 AM

## 2020-08-24 NOTE — Plan of Care (Signed)
  Problem: Acute Rehab PT Goals(only PT should resolve) Goal: Pt Will Go Supine/Side To Sit Outcome: Progressing Flowsheets (Taken 08/24/2020 1032) Pt will go Supine/Side to Sit:  with minimal assist  with moderate assist Goal: Patient Will Transfer Sit To/From Stand Outcome: Progressing Flowsheets (Taken 08/24/2020 1032) Patient will transfer sit to/from stand: with moderate assist Goal: Pt Will Transfer Bed To Chair/Chair To Bed Outcome: Progressing Flowsheets (Taken 08/24/2020 1032) Pt will Transfer Bed to Chair/Chair to Bed: with mod assist Goal: Pt Will Ambulate Outcome: Progressing Flowsheets (Taken 08/24/2020 1032) Pt will Ambulate:  10 feet  with moderate assist  with rolling walker   10:33 AM, 08/24/20 Ocie Bob, MPT Physical Therapist with [facility] [phone] office [phone] mobile phone

## 2020-08-24 NOTE — TOC Initial Note (Signed)
Transition of Care [facility]) - Initial/Assessment Note    Patient Details  Name: Louis Henderson MRN: [id number] Date of Birth: [birth year]  [facility] CM/SW Contact:    Annice Needy, LCSW Phone Number: 08/24/2020, 3:50 PM  Clinical Narrative:                 Patient from home with daughter and son in law. Requires assistance with ADLs. Uses walker previously owned by his wife. SNF recommendation discussed. Ms. Mayford Knife, daughter, desires HHPT. Discussed providers. She also request RW. PT recommended 3n1. Attending made aware of HH order needs and DME needs.   Expected Discharge Plan: Home w Home Health Services Barriers to Discharge: Continued Medical Work up   Patient Goals and CMS Choice Patient states their goals for this hospitalization and ongoing recovery are:: home with [facility] and DME      Expected Discharge Plan and Services Expected Discharge Plan: Home w Home Health Services     Post Acute Care Choice: Durable Medical Equipment,Home Health Living arrangements for the past 2 months: Single Family Home                                      Prior Living Arrangements/Services Living arrangements for the past 2 months: Single Family Home Lives with:: Adult Children Patient language and need for interpreter reviewed:: Yes Do you feel safe going back to the place where you live?: Yes      Need for Family Participation in Patient Care: Yes (Comment) Care giver support system in place?: Yes (comment)   Criminal Activity/Legal Involvement Pertinent to Current Situation/Hospitalization: No - Comment as needed  Activities of Daily Living Home Assistive Devices/Equipment: Wheelchair,Cane (specify quad or straight) ADL Screening (condition at time of admission) Patient's cognitive ability adequate to safely complete daily activities?: Yes Is the patient deaf or have difficulty hearing?: No Does the patient have difficulty seeing, even when wearing  glasses/contacts?: No Does the patient have difficulty concentrating, remembering, or making decisions?: No Patient able to express need for assistance with ADLs?: Yes Does the patient have difficulty dressing or bathing?: Yes Independently performs ADLs?: No Communication: Independent Dressing (OT): Needs assistance Is this a change from baseline?: Pre-admission baseline Grooming: Needs assistance Is this a change from baseline?: Pre-admission baseline Feeding: Independent Bathing: Needs assistance Is this a change from baseline?: Pre-admission baseline Toileting: Needs assistance Is this a change from baseline?: Pre-admission baseline In/Out Bed: Needs assistance Is this a change from baseline?: Pre-admission baseline Walks in Home: Independent with device (comment) (wheelchair) Does the patient have difficulty walking or climbing stairs?: Yes Weakness of Legs: Both Weakness of Arms/Hands: None  Permission Sought/Granted Permission sought to share information with : Family Supports    Share Information with NAME: Wandra Scot     Permission granted to share info w Relationship: daughter     Emotional Assessment         Alcohol / Substance Use: Not Applicable Psych Involvement: No (comment)  Admission diagnosis:  Dehydration [E86.0] Weakness [R53.1] AKI (acute kidney injury) (HCC) [N17.9] Patient Active Problem List   Diagnosis Date Noted  . AKI (acute kidney injury) (HCC) 08/23/2020  . SIRS (systemic inflammatory response syndrome) (HCC) 08/23/2020  . Diabetes mellitus type 2 in obese (HCC) 08/23/2020  . Lactic acidosis 08/23/2020  . Paget's disease of bone 08/23/2020   PCP:  Garald Braver Pharmacy:   [id number] [place], [place] - [address] AT [facility] [address] [place] [address] Phone: [phone] Fax: [phone]     Social Determinants of Health (SDOH) Interventions    Readmission Risk  Interventions No flowsheet data found.

## 2020-08-24 NOTE — Evaluation (Signed)
Physical Therapy Evaluation Patient Details Name: Louis Henderson MRN: [id number] DOB: 1928-10-31 Today's Date: 08/24/2020   History of Present Illness  Louis Henderson  is a 87 y.o. male, with hx HTN and DMII with a chief complaint of fever. Patient asks that I take history from his daughter.  Daughter reports that patient is here today because he was shivering all through the night.  She reports that he had a low-grade fever of 99.8 at home.  She reports that he states "that she took extra blankets" and got in bed with him overnight, but he was still not warm.  She reports that he did not stop shivering until about 1 AM.  Daughter reports that patient is not normally incontinent of his bowels but he was last night.  She does report that he has been having diarrhea for several weeks, but she cannot member how many weeks.  She reports watery-like stools 3 or 4 times a day.  She says sometimes they are just "squirts."  She has not seen any blood or melena.  She reports no nausea or vomiting.  Daughter reports that patient always has a cough that productive of white sputum, but his cough has not changed.  He has not complained of chest pain, dysuria, sores or rashes on his body.  Patient has had a decreased appetite.  She reports that he normally eats small servings, but today he did not eat at all.  She does report that he has had darker urine than normal as well.  Lastly she reports that he has had generalized weakness.  Normally daughter is able to help patient transfer from bed or chair to wheelchair, but he has been so weak that she has not been able to help him do that today.  Patient has poor insight into his health.  Continues to try to leave AGAINST MEDICAL ADVICE, requiring multiple providers to explain to him again why it is best for him to stay in the ED.  With that explanation patient does agree to stay.    Clinical Impression  Patient demonstrates slow labored movement for sitting up at  bedside requiring frequent verbal/tactile cueing for proper hand placement during supine to sitting and when scooting to EOB.  Patient very unsteady on feet, at high risk for falls due to frequent buckling of knees and limited to a few shuffling side steps before having to sit due to c/o fatigue/generalized weakness.  Patient tolerated sitting up in chair with his daughter present in room after therapy - RN aware.  Patient will benefit from continued physical therapy in hospital and recommended venue below to increase strength, balance, endurance for safe ADLs and gait.      Follow Up Recommendations SNF;Supervision for mobility/OOB;Supervision - Intermittent    Equipment Recommendations  None recommended by PT    Recommendations for Other Services       Precautions / Restrictions Precautions Precautions: Fall Restrictions Weight Bearing Restrictions: No      Mobility  Bed Mobility Overal bed mobility: Needs Assistance Bed Mobility: Supine to Sit     Supine to sit: Mod assist     General bed mobility comments: slow labored movement    Transfers Overall transfer level: Needs assistance Equipment used: Rolling walker (2 wheeled) Transfers: Sit to/from [employer] Sit to Stand: Max assist Stand pivot transfers: Max assist       General transfer comment: frequent buckling of knees due to weakness  Ambulation/Gait Ambulation/Gait assistance: Max Chemical engineer (  Feet): 3 Feet Assistive device: Rolling walker (2 wheeled) Gait Pattern/deviations: Decreased step length - right;Decreased step length - left;Decreased stride length;Shuffle Gait velocity: decreased   General Gait Details: limited to 2-3 slow labored shuffling steps with frequent buckling of knees due to weakness  Stairs            Wheelchair Mobility    Modified Rankin (Stroke Patients Only)       Balance Overall balance assessment: Needs assistance Sitting-balance support:  Feet supported;No upper extremity supported Sitting balance-Leahy Scale: Poor Sitting balance - Comments: L side lean if L UE not supporting Postural control: Left lateral lean Standing balance support: During functional activity;Bilateral upper extremity supported Standing balance-Leahy Scale: Poor Standing balance comment: Poor with RW                             Pertinent Vitals/Pain Pain Assessment: No/denies pain Faces Pain Scale: Hurts a little bit Pain Location: Bilateral lower feet Pain Descriptors / Indicators: Grimacing Pain Intervention(s): Monitored during session    Home Living Family/patient expects to be discharged to:: Private residence Living Arrangements: Children Available Help at Discharge: Family;Available 24 hours/day Type of Home: House Home Access: Ramped entrance     Home Layout: One level;Laundry or work area in basement;Other (Comment) (Patient does not go into basement) Home Equipment: Walker - 2 wheels;Wheelchair - manual;Shower seat Additional Comments: Family stated they have a bedside commode but it is old.    Prior Function Level of Independence: Needs assistance   Gait / Transfers Assistance Needed: short distanced household ambulator with assist, assisted for transfers, uses w/c for mobility  ADL's / Homemaking Assistance Needed: assisted by family        Hand Dominance   Dominant Hand: Right    Extremity/Trunk Assessment   Upper Extremity Assessment Upper Extremity Assessment: Defer to OT evaluation RUE Deficits / Details: Limited shoulder flexion A/ROM to ~90 to 100* LUE Deficits / Details: Limited shoulder flexion A/ROM to ~90 to 100* Reportedly from shoulder arthritis    Lower Extremity Assessment Lower Extremity Assessment: Generalized weakness    Cervical / Trunk Assessment Cervical / Trunk Assessment: Kyphotic  Communication   Communication: No difficulties  Cognition Arousal/Alertness:  Awake/alert Behavior During Therapy: WFL for tasks assessed/performed Overall Cognitive Status: Within Functional Limits for tasks assessed                                        General Comments      Exercises     Assessment/Plan    PT Assessment Patient needs continued PT services  PT Problem List Decreased strength;Decreased activity tolerance;Decreased balance;Decreased mobility       PT Treatment Interventions DME instruction;Gait training;Functional mobility training;Therapeutic activities;Therapeutic exercise;Patient/family education;Balance training    PT Goals (Current goals can be found in the Care Plan section)  Acute Rehab PT Goals Patient Stated Goal: return home PT Goal Formulation: With patient/family Time For Goal Achievement: 09/07/20 Potential to Achieve Goals: Good    Frequency Min 3X/week   Barriers to discharge        Co-evaluation PT/OT/SLP Co-Evaluation/Treatment: Yes Reason for Co-Treatment: Complexity of the patient's impairments (multi-system involvement);For patient/therapist safety PT goals addressed during session: Mobility/safety with mobility;Proper use of DME;Balance OT goals addressed during session: ADL's and self-care       AM-PAC PT "6 Clicks" Mobility  Outcome Measure Help needed turning from your back to your side while in a flat bed without using bedrails?: A Lot Help needed moving from lying on your back to sitting on the side of a flat bed without using bedrails?: A Lot Help needed moving to and from a bed to a chair (including a wheelchair)?: A Lot Help needed standing up from a chair using your arms (e.g., wheelchair or bedside chair)?: A Lot Help needed to walk in hospital room?: A Lot Help needed climbing 3-5 steps with a railing? : Total 6 Click Score: 11    End of Session   Activity Tolerance: Patient tolerated treatment well;Patient limited by fatigue Patient left: in chair;with call bell/phone  within reach;with family/visitor present;with chair alarm set Nurse Communication: Mobility status PT Visit Diagnosis: Unsteadiness on feet (R26.81);Other abnormalities of gait and mobility (R26.89);Muscle weakness (generalized) (M62.81)    Time: 6063-0160 PT Time Calculation (min) (ACUTE ONLY): 37 min   Charges:   PT Evaluation $PT Eval Moderate Complexity: 1 Mod PT Treatments $Therapeutic Activity: 23-37 mins        10:30 AM, 08/24/20 Ocie Bob, MPT Physical Therapist with [facility] [phone] office [phone] mobile phone

## 2020-08-24 NOTE — Progress Notes (Signed)
   08/23/20 2357  Assess: MEWS Score  Temp 99.7 F (37.6 C)  BP (!) 145/80  Pulse Rate (!) 126  Resp (!) 22  SpO2 96 %  O2 Device Room Air  Assess: MEWS Score  MEWS Temp 0  MEWS Systolic 0  MEWS Pulse 2  MEWS RR 1  MEWS LOC 0  MEWS Score 3  MEWS Score Color Yellow  Notify: Provider  Provider Name/Title Dr. Thomes Dinning  Date Provider Notified 08/24/20  Time Provider Notified 0006  Notification Type Page (secure chat and talked to MD over the phone)  Notification Reason Other (Comment) (MEWS 4)  Provider response No new orders;Other (Comment) (continue to monitor vital signs every hour)  Date of Provider Response 08/24/20  Time of Provider Response [phone]

## 2020-08-25 LAB — CBC
HCT: 36.4 % — ABNORMAL LOW (ref 39.0–52.0)
Hemoglobin: 12.1 g/dL — ABNORMAL LOW (ref 13.0–17.0)
MCH: 33 pg (ref 26.0–34.0)
MCHC: 33.2 g/dL (ref 30.0–36.0)
MCV: 99.2 fL (ref 80.0–100.0)
Platelets: 156 10*3/uL (ref 150–400)
RBC: 3.67 MIL/uL — ABNORMAL LOW (ref 4.22–5.81)
RDW: 13 % (ref 11.5–15.5)
WBC: 14.2 10*3/uL — ABNORMAL HIGH (ref 4.0–10.5)
nRBC: 0 % (ref 0.0–0.2)

## 2020-08-25 LAB — BASIC METABOLIC PANEL
Anion gap: 9 (ref 5–15)
BUN: 20 mg/dL (ref 8–23)
CO2: 25 mmol/L (ref 22–32)
Calcium: 7.9 mg/dL — ABNORMAL LOW (ref 8.9–10.3)
Chloride: 106 mmol/L (ref 98–111)
Creatinine, Ser: 1.24 mg/dL (ref 0.61–1.24)
GFR, Estimated: 56 mL/min — ABNORMAL LOW (ref >60)
Glucose, Bld: 97 mg/dL (ref 70–99)
Potassium: 3.8 mmol/L (ref 3.5–5.1)
Sodium: 140 mmol/L (ref 135–145)

## 2020-08-25 LAB — GLUCOSE, CAPILLARY
Glucose-Capillary: 107 mg/dL — ABNORMAL HIGH (ref 70–99)
Glucose-Capillary: 145 mg/dL — ABNORMAL HIGH (ref 70–99)
Glucose-Capillary: 179 mg/dL — ABNORMAL HIGH (ref 70–99)
Glucose-Capillary: 108 mg/dL — ABNORMAL HIGH (ref 70–99)

## 2020-08-25 MED ORDER — FAMOTIDINE 20 MG PO TABS
20.0000 mg | ORAL_TABLET | Freq: Every day | ORAL | Status: DC
  Administered 2020-08-26 – 2020-08-27 (×2): 20 mg via ORAL
  Filled 2020-08-25 (×2): qty 1

## 2020-08-25 NOTE — Progress Notes (Signed)
PT Cancellation Note  Patient Details Name: Louis Henderson MRN: [id number] DOB: 1928/12/17   Cancelled Treatment:    Reason Eval/Treat Not Completed: Pain limiting ability to participate Attempted PT session.  Pt limited by pain in LE with movement and heel pain that increased with palpation.  RN aware of pain and medication given prior session.  Pt c/o increased drowsiness with medication.  No PT session complete, daughter in room.  Pt left in call bell within reach.  Becky Sax, LPTA/CLT; CBIS [phone]  Juel Burrow 08/25/2020, 10:36 AM

## 2020-08-25 NOTE — Progress Notes (Signed)
PROGRESS NOTE    Patient: Louis Henderson                            PCP: Garald Braver                    DOB: [birth year]            DOA: 08/23/2020 OZH:[id number]             DOS: 08/25/2020, 1:59 PM   LOS: 2 days   Date of Service: The patient was seen and examined on 08/25/2020  Subjective:   The patient was seen and examined this morning, stating not feeling well, but hemodynamically stable denies any chest pain, improved diarrhea-loose stool.. Complaining of excessive generalized weaknesses  Daughter present at bedside reporting that he was be able to maintain his weight prior to this admission and ambulate now he is unable to ambulate or stand without assist  Brief Narrative:   Louis Henderson  is a 87 y.o. male, with hx HTN and DMII with a chief complaint of fever/chills overnight T-max at home 99.8.    Daughter reports that patient is not normally incontinent of his bowels but he was last night.  She does report that he has been having diarrhea for several weeks, but she cannot remember how many times in the past week.  She reports watery-like stools 3 or 4 times a day.  She says sometimes they are just "squirts."  She has not seen any blood or melena.  Also reported chronic cough, with no changes.  Patient has had a decreased appetite.  She reports that he normally eats small servings, but today he did not eat at all.  She does report that he has had darker urine than normal as well.  Lastly she reports that he has had generalized weakness.   Patient has poor insight into his health.   Continues to try to leave AGAINST MEDICAL ADVICE, requiring multiple providers to explain to him again why it is best for him to stay.   In the ED Temp 99.7, but goes as high as 102.2, heart rate 100-122, respiratory rate 20-23, blood pressure 129/67, satting at 99% Lactic acid increasing from 1.8- 2.4 CT abdomen shows wall thickening right rectosigmoid junction.  Also shows colonic  diverticulosis without diverticulitis.  Mild peripyloric gastric wall thickening that can be seen with gastritis or peptic ulcer disease.  Small hiatal hernia.  Prominently enlarged prostate gland causing mass-effect on the bladder base.  Left renal cyst including minimally complex cyst arising from the upper pole with a calcified septation.  Paget's disease of the left hemipelvis. White blood cell count 14,000, hemoglobin 12.7 Minimal hypokalemia at 3.4 Blood cultures pending Chemistry panel reveals a AKI with a creatinine baseline of 1.2, creatinine today 1.47 Hyperglycemia in the setting of diabetes mellitus type 2 at 174 UA was not suspicious for UTI EKG shows a heart rate of 108, sinus tach   Assessment & Plan:   Principal Problem:   SIRS (systemic inflammatory response syndrome) (HCC) Active Problems:   AKI (acute kidney injury) (HCC)   Diabetes mellitus type 2 in obese (HCC)   Lactic acidosis   Paget's disease of bone    SIRS criteria positive 1. On admission: patient has a heart rate of 120, respiratory rate of 27, lactate 2.4, white blood cell count 14.0, AKI >>> improved SIRS physiology 2. Sepsis ruled out 3. No  source identified yet, suspect intra-abdominal source 4. Lactic acid 2.4, 1.7,  CT abdomen pelvis reviewed: Irregular wall thickening, ?  Enteritis, colonic diverticulosis without diverticulitis,?  Bilateral gastritis... Enlarged prostate with mass-effect, left renal cyst, Paget disease of the left hemipelvis  5. Broad-spectrum antibiotics started 6. Blood pressure remained stable status post IV fluid bolus, resuscitation discontinuing IV fluids now 7. Blood cultures >>> 1 out of 2 cultures growing gram-positive rods 8. Stool-C. difficile negative 9. UA not indicative of UTI, but since no source has been identified and abnormality of bladder on CT, urine culture >>>  10. Continue Tylenol.  Fever 11. Chest x-ray shows no acute disease 12. CT scan does not show  evidence of an infection 13. Continue to monitor 14. Discontinue vancomycin today 08/25/2020, continue cefepime  AKI -acute kidney injury 1. Baseline creatinine seems to be around 1.2, creatinine today 1.47 >> 1.24 2. Responded to volume resuscitation 3. Monitoring closely, avoiding nephrotoxins   Lactic acidosis 1. Continue to trend lactic acid 2.4 >>1.8. 1.7 >>> 2.9  > 2. Continue to monitor  Paget's disease of bone 1. May require Ortho consult as outpatient -no acute intervention recommended with his advanced age and comorbidities 2. With pain on CT scan 3. Continue to monitor  Diabetes mellitus type 2 1. Hemoglobin A1c 6.0  2. Hold oral hypoglycemics 3. Checking his blood sugar QA CHS, with SSI coverage        ----------------------------------------------------------------------------------------------------------------------------------------------- Nutritional status:  The patient's BMI is: Body mass index is 34.09 kg/m. I agree with the assessment and plan as outlined  Skin Assessment: I have examined the patient's skin and I agree with the wound assessment as performed by wound care team As outlined ---------------------------------------------------------------------------------------------------------------------------------------------------- Cultures; Blood Cultures x 2 >> 2 blood cultures growing gram-positive rods Urine Culture  >>> NGT    Antimicrobials: IV antibiotics of cefepime/vancomycin/Flagyl   Consultants: None   ---------------------------------------------------------------------------------------------------------------------------------------------  DVT prophylaxis:  SCD/Compression stockings and Heparin SQ Code Status:   Code Status: Full Code  Family Communication: Daughter updated at bedside The above findings and plan of care has been discussed with patient (daughter)  in detail,  they expressed understanding and agreement of  above. -Advance care planning has been discussed.   Admission status:   Status is: Inpatient  Remains inpatient appropriate because:Inpatient level of care appropriate due to severity of illness   Dispo: The patient is from: Home              Anticipated d/c is to: Home with home health              Patient currently is not medically stable to d/c.   Difficult to place patient No      Level of care: Med-Surg   Procedures:   No admission procedures for hospital encounter.     Antimicrobials:  Anti-infectives (From admission, onward)   Start     Dose/Rate Route Frequency Ordered Stop   08/24/20 2200  vancomycin (VANCOREADY) IVPB 750 mg/150 mL  Status:  Discontinued        750 mg 150 mL/hr over 60 Minutes Intravenous Every 24 hours 08/23/20 2142 08/25/20 0945   08/24/20 1000  ceFEPIme (MAXIPIME) 2 g in sodium chloride 0.9 % 100 mL IVPB        2 g 200 mL/hr over 30 Minutes Intravenous Every 12 hours 08/23/20 2122     08/24/20 0100  metroNIDAZOLE (FLAGYL) IVPB 500 mg        500 mg 100  mL/hr over 60 Minutes Intravenous Every 8 hours 08/23/20 2352     08/24/20 0045  ceFEPIme (MAXIPIME) 2 g in sodium chloride 0.9 % 100 mL IVPB  Status:  Discontinued        2 g 200 mL/hr over 30 Minutes Intravenous  Once 08/23/20 2352 08/23/20 2354   08/24/20 0045  vancomycin (VANCOCIN) IVPB 1000 mg/200 mL premix  Status:  Discontinued        1,000 mg 200 mL/hr over 60 Minutes Intravenous  Once 08/23/20 2352 08/23/20 2354   08/23/20 2215  vancomycin (VANCOREADY) IVPB 750 mg/150 mL       "And" Linked Group Details   750 mg 150 mL/hr over 60 Minutes Intravenous  Once 08/23/20 2103 08/23/20 2318   08/23/20 2115  vancomycin (VANCOCIN) IVPB 1000 mg/200 mL premix       "And" Linked Group Details   1,000 mg 200 mL/hr over 60 Minutes Intravenous  Once 08/23/20 2103 08/23/20 2314   08/23/20 2100  vancomycin (VANCOREADY) IVPB 1750 mg/350 mL  Status:  Discontinued        1,750 mg 175 mL/hr over  120 Minutes Intravenous  Once 08/23/20 2052 08/23/20 2104   08/23/20 2015  ceFEPIme (MAXIPIME) 2 g in sodium chloride 0.9 % 100 mL IVPB        2 g 200 mL/hr over 30 Minutes Intravenous  Once 08/23/20 2004 08/23/20 2121       Medication:  . amLODipine  5 mg Oral Daily  . aspirin  81 mg Oral Daily  . carvedilol  3.125 mg Oral Q breakfast  . famotidine  40 mg Oral Daily  . heparin  5,000 Units Subcutaneous Q8H  . insulin aspart  0-9 Units Subcutaneous TID WC  . lactobacillus acidophilus  1 tablet Oral TID  . simvastatin  20 mg Oral q1800  . traZODone  50 mg Oral QHS    acetaminophen **OR** acetaminophen, ondansetron **OR** ondansetron (ZOFRAN) IV, oxyCODONE   Objective:   Vitals:   08/24/20 1324 08/24/20 1849 08/24/20 2143 08/25/20 0638  BP: 125/61 124/72 123/65 (!) 154/75  Pulse: (!) 102 100 (!) 102 100  Resp: 18 18 16 18   Temp: 100.1 F (37.8 C) 100.3 F (37.9 C) 98.2 F (36.8 C) 99.1 F (37.3 C)  TempSrc: Oral Oral Oral Oral  SpO2: 98% 100% 97% 98%  Weight:      Height:        Intake/Output Summary (Last 24 hours) at 08/25/2020 1359 Last data filed at 08/25/2020 0900 Gross per 24 hour  Intake 1150.9 ml  Output 200 ml  Net 950.9 ml   Filed Weights   08/23/20 2044  Weight: 87.3 kg     Examination:     Physical Exam:   General:  Alert, oriented, cooperative, no distress;   HEENT:  Normocephalic, PERRL, otherwise with in Normal limits   Neuro:  CNII-XII intact. , normal motor and sensation, reflexes intact   Lungs:   Clear to auscultation BL, Respirations unlabored, no wheezes / crackles  Cardio:    S1/S2, RRR, No murmure, No Rubs or Gallops   Abdomen:   Soft, non-tender, bowel sounds active all four quadrants,  no guarding or peritoneal signs.  Muscular skeletal:  severe generalized weaknesses Limited exam - in bed, able to move all 4 extremities, Normal strength,  2+ pulses,  symmetric, No pitting edema  Skin:  Dry, warm to touch, negative for any  Rashes,  Wounds: Please see nursing documentation         ------------------------------------------------------------------------------------------------------------------------------------------  LABs:  CBC Latest Ref Rng & Units 08/25/2020 08/24/2020 08/23/2020  WBC 4.0 - 10.5 K/uL 14.2(H) 16.2(H) 14.1(H)  Hemoglobin 13.0 - 17.0 g/dL 12.1(L) 12.3(L) 12.7(L)  Hematocrit 39.0 - 52.0 % 36.4(L) 38.5(L) 38.6(L)  Platelets 150 - 400 K/uL 156 166 200   CMP Latest Ref Rng & Units 08/25/2020 08/24/2020 08/23/2020  Glucose 70 - 99 mg/dL 97 092(Z) 300(T)  BUN 8 - 23 mg/dL 20 19 62(U)  Creatinine 0.61 - 1.24 mg/dL 6.33 3.54 5.62(B)  Sodium 135 - 145 mmol/L 140 137 135  Potassium 3.5 - 5.1 mmol/L 3.8 3.1(L) 3.4(L)  Chloride 98 - 111 mmol/L 106 104 100  CO2 22 - 32 mmol/L 25 23 26   Calcium 8.9 - 10.3 mg/dL 7.9(L) 8.0(L) 8.5(L)  Total Protein 6.5 - 8.1 g/dL - 5.6(L) 6.8  Total Bilirubin 0.3 - 1.2 mg/dL - 1.2 1.2  Alkaline Phos 38 - 126 U/L - 69 80  AST 15 - 41 U/L - 21 15  ALT 0 - 44 U/L - 10 9       Micro Results Recent Results (from the past 240 hour(s))  Resp Panel by RT-PCR (Flu A&B, Covid) Nasopharyngeal Swab     Status: None   Collection Time: 08/23/20  2:11 PM   Specimen: Nasopharyngeal Swab; Nasopharyngeal(NP) swabs in vial transport medium  Result Value Ref Range Status   SARS Coronavirus 2 by RT PCR NEGATIVE NEGATIVE Final    Comment: (NOTE) SARS-CoV-2 target nucleic acids are NOT DETECTED.  The SARS-CoV-2 RNA is generally detectable in upper respiratory specimens during the acute phase of infection. The lowest concentration of SARS-CoV-2 viral copies this assay can detect is 138 copies/mL. A negative result does not preclude SARS-Cov-2 infection and should not be used as the sole basis for treatment or other patient management decisions. A negative result may occur with  improper specimen collection/handling, submission of specimen other than nasopharyngeal swab,  presence of viral mutation(s) within the areas targeted by this assay, and inadequate number of viral copies(<138 copies/mL). A negative result must be combined with clinical observations, patient history, and epidemiological information. The expected result is Negative.  Fact Sheet for Patients:  08/25/20  Fact Sheet for Healthcare Providers:  BloggerCourse.com  This test is no t yet approved or cleared by the [employer] FDA and  has been authorized for detection and/or diagnosis of SARS-CoV-2 by FDA under an Emergency Use Authorization (EUA). This EUA will remain  in effect (meaning this test can be used) for the duration of the COVID-19 declaration under Section 564(b)(1) of the Act, 21 U.S.C.section 360bbb-3(b)(1), unless the authorization is terminated  or revoked sooner.       Influenza A by PCR NEGATIVE NEGATIVE Final   Influenza B by PCR NEGATIVE NEGATIVE Final    Comment: (NOTE) The Xpert Xpress SARS-CoV-2/FLU/RSV plus assay is intended as an aid in the diagnosis of influenza from Nasopharyngeal swab specimens and should not be used as a sole basis for treatment. Nasal washings and aspirates are unacceptable for Xpert Xpress SARS-CoV-2/FLU/RSV testing.  Fact Sheet for Patients: [place]  Fact Sheet for Healthcare Providers: BloggerCourse.com  This test is not yet approved or cleared by the [employer] FDA and has been authorized for detection and/or diagnosis of SARS-CoV-2 by FDA under an Emergency Use Authorization (EUA). This EUA will remain in effect (meaning this test can be used) for the duration of the COVID-19 declaration under Section 564(b)(1) of the Act, 21 U.S.C. section 360bbb-3(b)(1), unless the authorization  is terminated or revoked.  Performed at [facility], [address], [place], [place] [address]   Culture, blood (routine x  2)     Status: None (Preliminary result)   Collection Time: 08/23/20  3:33 PM   Specimen: BLOOD  Result Value Ref Range Status   Specimen Description BLOOD BLOOD RIGHT HAND  Final   Special Requests   Final    Blood Culture results may not be optimal due to an inadequate volume of blood received in culture bottles BOTTLES DRAWN AEROBIC AND ANAEROBIC   Culture   Final    NO GROWTH 2 DAYS Performed at [facility], [address], [place], [place] [address]    Report Status PENDING  Incomplete  Culture, blood (routine x 2)     Status: None (Preliminary result)   Collection Time: 08/23/20  3:33 PM   Specimen: BLOOD  Result Value Ref Range Status   Specimen Description   Final    BLOOD RIGHT ANTECUBITAL Performed at [facility], [address], [place], [place] [address]    Special Requests   Final    Blood Culture results may not be optimal due to an inadequate volume of blood received in culture bottles BOTTLES DRAWN AEROBIC AND ANAEROBIC Performed at [facility], [address], [place], [place] [address]    Culture  Setup Time   Final    ANAEROBIC BOTTLE ONLY GRAM POSITIVE RODS Gram Stain Report Called to,Read Back By and Verified With: P BROWM,[id number]  08/24/20 [facility] Performed at [facility], [address], [place], [place] [address]    Culture GRAM POSITIVE RODS  Final   Report Status PENDING  Incomplete  C Difficile Quick Screen w PCR reflex     Status: None   Collection Time: 08/24/20  3:50 PM   Specimen: STOOL  Result Value Ref Range Status   C Diff antigen NEGATIVE NEGATIVE Final   C Diff toxin NEGATIVE NEGATIVE Final   C Diff interpretation No C. difficile detected.  Final    Comment: Performed at [facility], [address], [place], [place] [address]    Radiology Reports CT ABDOMEN PELVIS WO CONTRAST  Result Date: 08/23/2020 CLINICAL DATA:  Acute abdominal pain. Diffuse pain with diarrhea. Weakness. EXAM: CT ABDOMEN AND PELVIS WITHOUT CONTRAST TECHNIQUE:  Multidetector CT imaging of the abdomen and pelvis was performed following the standard protocol without IV contrast. COMPARISON:  None. FINDINGS: Lower chest: Subsegmental atelectasis in the lingula. Mild hypoventilatory change in the dependent lungs. No confluent consolidation or pleural fluid. Aortic valvular calcifications. Normal heart size. Hepatobiliary: No focal hepatic lesion on noncontrast exam. Mild gallbladder distension but no calcified gallstone. No pericholecystic inflammation. No biliary dilatation. Pancreas: No ductal dilatation or inflammation. Spleen: Normal in size without focal abnormality. Adrenals/Urinary Tract: Adrenal nodule. Slight adrenal thickening. No hydronephrosis. No perinephric edema. Cyst arising from the upper pole of the left kidney measures 7.4 cm and has thin calcified septations. Cyst arising from the lower left kidney measures 5.7 cm without internal septations. Small low-density lesion arising from the mid right kidney is too small to characterize but likely cyst. Calcification in the right renal hilum is felt to be vascular rather than nonobstructing stone. Urinary bladder is partially distended, elevated by prominently enlarged prostate gland. Stomach/Bowel: Small hiatal hernia. Partially distended stomach. Mild peri pyloric gastric wall thickening, series 2, image 29. No small bowel obstruction or inflammation. There are a few fluid-filled small bowel loops in the pelvis without perienteric inflammation or evidence  of wall thickening. Appendix is not definitively seen, no evidence of appendicitis. Nondistended ascending colon which limits assessment for wall thickening. Moderate stool in the transverse, descending, and sigmoid colon. No wall thickening of these colonic segments. Occasional colonic diverticula. There is question of irregular wall thickening involving the right rectosigmoid junction, for example series 2, images 75 and 79. no adjacent inflammation.  Vascular/Lymphatic: Moderately advanced aortic atherosclerosis. No aortic aneurysm. Moderate branch atherosclerosis. No enlarged lymph nodes in the abdomen or pelvis. Reproductive: Prostate gland is prominently enlarged spanning 6.6 x 7.4 x 6.8 cm (volume = 170 cm^3). This causes mass effect on the bladder base. Other: No free air or ascites.  No abdominal wall hernia. Musculoskeletal: Diffuse degenerative change throughout the spine with degenerative disc disease and facet hypertrophy. Degenerative change of the pubic symphysis and both hips. Cortical and trabecular thickening involving the left hemipelvis typical of Paget's disease. There may be some cortical thickening involving the posterior right iliac bone as well. IMPRESSION: 1. Questionable irregular wall thickening involving the right rectosigmoid junction. Recommend correlation with physical and direct visualization/colonoscopy to exclude underlying colonic neoplasm. 2. Colonic diverticulosis without diverticulitis. 3. Mild peri pyloric gastric wall thickening, can be seen with gastritis or peptic ulcer disease. Small hiatal hernia. 4. Prominently enlarged prostate gland causing mass effect on the bladder base. 5. Left renal cysts, including a minimally complex cyst arising from the upper pole with a calcified septation. 6. Paget's disease of the left hemipelvis. There may be some cortical thickening involving the posterior right iliac bone as well. Aortic Atherosclerosis (ICD10-I70.0). Electronically Signed   By: Narda Rutherford M.D.   On: 08/23/2020 16:57   DG Chest Portable 1 View  Result Date: 08/23/2020 CLINICAL DATA:  Weakness, chills and diarrhea beginning yesterday. EXAM: PORTABLE CHEST 1 VIEW COMPARISON:  None. FINDINGS: The lungs are clear. Heart size is normal. Aortic atherosclerosis. No pneumothorax or pleural fluid. No acute or focal bony abnormality. IMPRESSION: No acute disease. Aortic Atherosclerosis (ICD10-I70.0). Electronically  Signed   By: Drusilla Kanner M.D.   On: 08/23/2020 15:02    SIGNED: Kendell Bane, MD, FHM. [facility],  Pager (please use [email or web address] to page/text) Please use Epic Secure Chat for non-urgent communication (7AM-7PM)  If 7PM-7AM, please contact night-coverage [email or web address], 08/25/2020, 1:59 PM

## 2020-08-25 NOTE — Progress Notes (Signed)
NT went in to get pt OOB and transferred torecliner chair, pts daughter states he was comfortable and that he is "dead weight" and very hard to transfer. Pts daughter told me this morning that she was able to transfer her dad yesterday by herself with PT on standby. MD notified of this.

## 2020-08-26 ENCOUNTER — Inpatient Hospital Stay (HOSPITAL_COMMUNITY): Payer: Medicare Other

## 2020-08-26 LAB — GLUCOSE, CAPILLARY
Glucose-Capillary: 119 mg/dL — ABNORMAL HIGH (ref 70–99)
Glucose-Capillary: 157 mg/dL — ABNORMAL HIGH (ref 70–99)
Glucose-Capillary: 150 mg/dL — ABNORMAL HIGH (ref 70–99)
Glucose-Capillary: 168 mg/dL — ABNORMAL HIGH (ref 70–99)

## 2020-08-26 LAB — URINE CULTURE

## 2020-08-26 LAB — BASIC METABOLIC PANEL
Anion gap: 9 (ref 5–15)
BUN: 18 mg/dL (ref 8–23)
CO2: 24 mmol/L (ref 22–32)
Calcium: 7.9 mg/dL — ABNORMAL LOW (ref 8.9–10.3)
Chloride: 105 mmol/L (ref 98–111)
Creatinine, Ser: 1.11 mg/dL (ref 0.61–1.24)
GFR, Estimated: >60 mL/min (ref >60)
Glucose, Bld: 124 mg/dL — ABNORMAL HIGH (ref 70–99)
Potassium: 3.4 mmol/L — ABNORMAL LOW (ref 3.5–5.1)
Sodium: 138 mmol/L (ref 135–145)

## 2020-08-26 LAB — CBC
HCT: 35.1 % — ABNORMAL LOW (ref 39.0–52.0)
Hemoglobin: 11.7 g/dL — ABNORMAL LOW (ref 13.0–17.0)
MCH: 32.5 pg (ref 26.0–34.0)
MCHC: 33.3 g/dL (ref 30.0–36.0)
MCV: 97.5 fL (ref 80.0–100.0)
Platelets: 192 10*3/uL (ref 150–400)
RBC: 3.6 MIL/uL — ABNORMAL LOW (ref 4.22–5.81)
RDW: 13.1 % (ref 11.5–15.5)
WBC: 14.1 10*3/uL — ABNORMAL HIGH (ref 4.0–10.5)
nRBC: 0 % (ref 0.0–0.2)

## 2020-08-26 LAB — CULTURE, BLOOD (ROUTINE X 2)

## 2020-08-26 MED ORDER — SODIUM CHLORIDE 0.9 % IV SOLN
2.0000 g | INTRAVENOUS | Status: DC
  Administered 2020-08-26: 2 g via INTRAVENOUS
  Filled 2020-08-26: qty 20

## 2020-08-26 MED ORDER — ALPRAZOLAM 0.5 MG PO TABS
0.5000 mg | ORAL_TABLET | Freq: Three times a day (TID) | ORAL | Status: DC | PRN
  Administered 2020-08-26 – 2020-08-27 (×2): 0.5 mg via ORAL
  Filled 2020-08-26 (×2): qty 1

## 2020-08-26 MED ORDER — METRONIDAZOLE 500 MG PO TABS
500.0000 mg | ORAL_TABLET | Freq: Three times a day (TID) | ORAL | Status: DC
  Administered 2020-08-26 – 2020-08-27 (×3): 500 mg via ORAL
  Filled 2020-08-26 (×4): qty 1

## 2020-08-26 NOTE — Progress Notes (Signed)
PT Cancellation Note  Patient Details Name: Louis Henderson MRN: [id number] DOB: Dec 18, 1928   Cancelled Treatment:    Reason Eval/Treat Not Completed: Fatigue/lethargy limiting ability to participate; Patient sleeping upon entrance for session. Family stated patient finally resting and asked if patient could participate another day.   1:58 PM, 08/26/20 Wyman Songster PT, DPT Physical Therapist at [facility]

## 2020-08-26 NOTE — Progress Notes (Signed)
Prevalon boots applied to bilateral lower extremities.

## 2020-08-26 NOTE — Progress Notes (Signed)
PROGRESS NOTE    Patient: Louis Henderson                            PCP: Garald Braver                    DOB: 12/04/1928            DOA: 08/23/2020 WLN:[id number]             DOS: 08/26/2020, 11:00 AM   LOS: 3 days   Date of Service: The patient was seen and examined on 08/26/2020  Subjective:   The patient was seen and examined this morning, awake alert in no acute distress sitting up in bed. Still complaining of generalized weaknesses.  Daughter present at bedside, plan of care has been discussed in detail   Brief Narrative:   Louis Henderson  is a 87 y.o. male, with hx HTN and DMII with a chief complaint of fever/chills overnight T-max at home 99.8.    Daughter reports that patient is not normally incontinent of his bowels but he was last night.  She does report that he has been having diarrhea for several weeks, but she cannot remember how many times in the past week.  She reports watery-like stools 3 or 4 times a day.  She says sometimes they are just "squirts."  She has not seen any blood or melena.  Also reported chronic cough, with no changes.  Patient has had a decreased appetite.  She reports that he normally eats small servings, but today he did not eat at all.  She does report that he has had darker urine than normal as well.  Lastly she reports that he has had generalized weakness.   Patient has poor insight into his health.   Continues to try to leave AGAINST MEDICAL ADVICE, requiring multiple providers to explain to him again why it is best for him to stay.   In the ED Temp 99.7, but goes as high as 102.2, heart rate 100-122, respiratory rate 20-23, blood pressure 129/67, satting at 99% Lactic acid increasing from 1.8- 2.4 CT abdomen shows wall thickening right rectosigmoid junction.  Also shows colonic diverticulosis without diverticulitis.  Mild peripyloric gastric wall thickening that can be seen with gastritis or peptic ulcer disease.  Small hiatal hernia.   Prominently enlarged prostate gland causing mass-effect on the bladder base.  Left renal cyst including minimally complex cyst arising from the upper pole with a calcified septation.  Paget's disease of the left hemipelvis. White blood cell count 14,000, hemoglobin 12.7 Minimal hypokalemia at 3.4 Blood cultures pending Chemistry panel reveals a AKI with a creatinine baseline of 1.2, creatinine today 1.47 Hyperglycemia in the setting of diabetes mellitus type 2 at 174 UA was not suspicious for UTI EKG shows a heart rate of 108, sinus tach   Assessment & Plan:   Principal Problem:   SIRS (systemic inflammatory response syndrome) (HCC) Active Problems:   AKI (acute kidney injury) (HCC)   Diabetes mellitus type 2 in obese (HCC)   Lactic acidosis   Paget's disease of bone    SIRS criteria positive 1. On admission: patient has a heart rate of 120, respiratory rate of 27, lactate 2.4, white blood cell count 14.0, AKI >>> improved SIRS physiology 2. Sepsis ruled out 3. No source identified yet, suspect intra-abdominal source 4. Lactic acid 2.4, 1.7,  CT abdomen pelvis reviewed: Irregular wall thickening, ?  Enteritis, colonic diverticulosis without diverticulitis,?  Bilateral gastritis... Enlarged prostate with mass-effect, left renal cyst, Paget disease of the left hemipelvis  5. Broad-spectrum antibiotics started 6. Blood pressure remained stable status post IV fluid bolus, resuscitation discontinuing IV fluids now 7. Blood cultures >>> 1 out of 2 cultures growing gram-positive rods, repeating blood cultures today 8. Stool-C. difficile negative 9. UA not indicative of UTI, but since no source has been identified and abnormality of bladder on CT, urine culture >>>  10. Continue Tylenol.  Fever 11. Chest x-ray shows no acute disease 12. CT scan does not show evidence of an infection 13. Much improved symptoms 14. Discontinue vancomycin today 08/25/2020, continue cefepime, changing Flagyl  to p.o.  AKI -acute kidney injury 1. Baseline creatinine seems to be around 1.2, creatinine today 1.47 >> 1.24 2. Responded to volume resuscitation 3. Monitoring closely, avoiding nephrotoxins 4. Improved   Lactic acidosis 1. Continue to trend lactic acid 2.4 >>1.8. 1.7 >>> 2.9  > 2. Continue to monitor 3. Resolved  Paget's disease of bone 1. May require Ortho consult as outpatient -no acute intervention recommended with his advanced age and comorbidities 2. With pain on CT scan 3. Continue to monitor  Diabetes mellitus type 2 1. Hemoglobin A1c 6.0  2. Hold oral hypoglycemics 3. Checking his blood sugar QA CHS, with SSI coverage  Severe generalized weakness/debility -PT/OT consult for evaluation--commending SNF patient's daughter is willing to take the patient home with home health -High risk for fall, fall precautions      ----------------------------------------------------------------------------------------------------------------------------------------------- Nutritional status:  The patient's BMI is: Body mass index is 34.09 kg/m. I agree with the assessment and plan as outlined  Skin Assessment: I have examined the patient's skin and I agree with the wound assessment as performed by wound care team As outlined ---------------------------------------------------------------------------------------------------------------------------------------------------- Cultures; Blood Cultures x 2 >> 2 blood cultures growing gram-positive rods Urine Culture  >>> NGT    Antimicrobials: IV antibiotics of cefepime/vancomycin/Flagyl   Consultants: None   ---------------------------------------------------------------------------------------------------------------------------------------------  DVT prophylaxis:  SCD/Compression stockings and Heparin SQ Code Status:   Code Status: Full Code  Family Communication: Daughter updated at bedside The above findings and plan  of care has been discussed with patient (daughter)  in detail,  they expressed understanding and agreement of above. -Advance care planning has been discussed.   Admission status:   Status is: Inpatient  Remains inpatient appropriate because:Inpatient level of care appropriate due to severity of illness   Dispo: The patient is from: Home              Anticipated d/c is to: Home with home health              Patient currently is not medically stable to d/c.   Difficult to place patient No      Level of care: Med-Surg   Procedures:   No admission procedures for hospital encounter.     Antimicrobials:  Anti-infectives (From admission, onward)   Start     Dose/Rate Route Frequency Ordered Stop   08/26/20 1400  metroNIDAZOLE (FLAGYL) tablet 500 mg        500 mg Oral Every 8 hours 08/26/20 0916     08/24/20 2200  vancomycin (VANCOREADY) IVPB 750 mg/150 mL  Status:  Discontinued        750 mg 150 mL/hr over 60 Minutes Intravenous Every 24 hours 08/23/20 2142 08/25/20 0945   08/24/20 1000  ceFEPIme (MAXIPIME) 2 g in sodium chloride 0.9 % 100 mL  IVPB        2 g 200 mL/hr over 30 Minutes Intravenous Every 12 hours 08/23/20 2122     08/24/20 0100  metroNIDAZOLE (FLAGYL) IVPB 500 mg  Status:  Discontinued        500 mg 100 mL/hr over 60 Minutes Intravenous Every 8 hours 08/23/20 2352 08/26/20 0916   08/24/20 0045  ceFEPIme (MAXIPIME) 2 g in sodium chloride 0.9 % 100 mL IVPB  Status:  Discontinued        2 g 200 mL/hr over 30 Minutes Intravenous  Once 08/23/20 2352 08/23/20 2354   08/24/20 0045  vancomycin (VANCOCIN) IVPB 1000 mg/200 mL premix  Status:  Discontinued        1,000 mg 200 mL/hr over 60 Minutes Intravenous  Once 08/23/20 2352 08/23/20 2354   08/23/20 2215  vancomycin (VANCOREADY) IVPB 750 mg/150 mL       "And" Linked Group Details   750 mg 150 mL/hr over 60 Minutes Intravenous  Once 08/23/20 2103 08/23/20 2318   08/23/20 2115  vancomycin (VANCOCIN) IVPB 1000  mg/200 mL premix       "And" Linked Group Details   1,000 mg 200 mL/hr over 60 Minutes Intravenous  Once 08/23/20 2103 08/23/20 2314   08/23/20 2100  vancomycin (VANCOREADY) IVPB 1750 mg/350 mL  Status:  Discontinued        1,750 mg 175 mL/hr over 120 Minutes Intravenous  Once 08/23/20 2052 08/23/20 2104   08/23/20 2015  ceFEPIme (MAXIPIME) 2 g in sodium chloride 0.9 % 100 mL IVPB        2 g 200 mL/hr over 30 Minutes Intravenous  Once 08/23/20 2004 08/23/20 2121       Medication:  . amLODipine  5 mg Oral Daily  . aspirin  81 mg Oral Daily  . carvedilol  3.125 mg Oral Q breakfast  . famotidine  20 mg Oral Daily  . heparin  5,000 Units Subcutaneous Q8H  . insulin aspart  0-9 Units Subcutaneous TID WC  . lactobacillus acidophilus  1 tablet Oral TID  . metroNIDAZOLE  500 mg Oral Q8H  . simvastatin  20 mg Oral q1800  . traZODone  50 mg Oral QHS    acetaminophen **OR** acetaminophen, ALPRAZolam, ondansetron **OR** ondansetron (ZOFRAN) IV, oxyCODONE   Objective:   Vitals:   08/25/20 1408 08/25/20 2053 08/26/20 0543 08/26/20 0937  BP: 135/78 136/84 (!) 143/76 (!) 145/79  Pulse: 95 94 99 94  Resp: 18 18 18 19   Temp: (!) 100.4 F (38 C) 98.7 F (37.1 C) 98.4 F (36.9 C) 98.8 F (37.1 C)  TempSrc: Oral Oral  Oral  SpO2: 99% 98% 96% 97%  Weight:      Height:        Intake/Output Summary (Last 24 hours) at 08/26/2020 1100 Last data filed at 08/26/2020 0526 Gross per 24 hour  Intake 440 ml  Output 475 ml  Net -35 ml   Filed Weights   08/23/20 2044  Weight: 87.3 kg     Examination:       Physical Exam:   General:  Alert, oriented, cooperative, no distress;   HEENT:  Normocephalic, PERRL, otherwise with in Normal limits   Neuro:  CNII-XII intact. , normal motor and sensation, reflexes intact   Lungs:   Clear to auscultation BL, Respirations unlabored, no wheezes / crackles  Cardio:    S1/S2, RRR, No murmure, No Rubs or Gallops   Abdomen:   Soft, non-tender, bowel  sounds  active all four quadrants,  no guarding or peritoneal signs.  Muscular skeletal:   Severe generalized weakness, Limited exam - in bed, able to move all 4 extremities, Normal strength,  2+ pulses,  symmetric, No pitting edema  Skin:  Dry, warm to touch, negative for any Rashes,  Wounds: Please see nursing documentation           ------------------------------------------------------------------------------------------------------------------------------------------    LABs:  CBC Latest Ref Rng & Units 08/26/2020 08/25/2020 08/24/2020  WBC 4.0 - 10.5 K/uL 14.1(H) 14.2(H) 16.2(H)  Hemoglobin 13.0 - 17.0 g/dL 11.7(L) 12.1(L) 12.3(L)  Hematocrit 39.0 - 52.0 % 35.1(L) 36.4(L) 38.5(L)  Platelets 150 - 400 K/uL 192 156 166   CMP Latest Ref Rng & Units 08/26/2020 08/25/2020 08/24/2020  Glucose 70 - 99 mg/dL 299(B) 97 716(R)  BUN 8 - 23 mg/dL 18 20 19   Creatinine 0.61 - 1.24 mg/dL 6.78 9.38  Sodium 135 - 145 mmol/L 138 140 137  Potassium 3.5 - 5.1 mmol/L 3.4(L) 3.8 3.1(L)  Chloride 98 - 111 mmol/L 105 106 104  CO2 22 - 32 mmol/L 24 25 23   Calcium 8.9 - 10.3 mg/dL 7.9(L) 7.9(L) 8.0(L)  Total Protein 6.5 - 8.1 g/dL - - 5.6(L)  Total Bilirubin 0.3 - 1.2 mg/dL - - 1.2  Alkaline Phos 38 - 126 U/L - - 69  AST 15 - 41 U/L - - 21  ALT 0 - 44 U/L - - 10       Micro Results Recent Results (from the past 240 hour(s))  Resp Panel by RT-PCR (Flu A&B, Covid) Nasopharyngeal Swab     Status: None   Collection Time: 08/23/20  2:11 PM   Specimen: Nasopharyngeal Swab; Nasopharyngeal(NP) swabs in vial transport medium  Result Value Ref Range Status   SARS Coronavirus 2 by RT PCR NEGATIVE NEGATIVE Final    Comment: (NOTE) SARS-CoV-2 target nucleic acids are NOT DETECTED.  The SARS-CoV-2 RNA is generally detectable in upper respiratory specimens during the acute phase of infection. The lowest concentration of SARS-CoV-2 viral copies this assay can detect is 138 copies/mL. A negative result  does not preclude SARS-Cov-2 infection and should not be used as the sole basis for treatment or other patient management decisions. A negative result may occur with  improper specimen collection/handling, submission of specimen other than nasopharyngeal swab, presence of viral mutation(s) within the areas targeted by this assay, and inadequate number of viral copies(<138 copies/mL). A negative result must be combined with clinical observations, patient history, and epidemiological information. The expected result is Negative.  Fact Sheet for Patients:   Fact Sheet for Healthcare Providers:  08/25/20  This test is no t yet approved or cleared by the [employer] FDA and  has been authorized for detection and/or diagnosis of SARS-CoV-2 by FDA under an Emergency Use Authorization (EUA). This EUA will remain  in effect (meaning this test can be used) for the duration of the COVID-19 declaration under Section 564(b)(1) of the Act, 21 U.S.C.section 360bbb-3(b)(1), unless the authorization is terminated  or revoked sooner.       Influenza A by PCR NEGATIVE NEGATIVE Final   Influenza B by PCR NEGATIVE NEGATIVE Final    Comment: (NOTE) The Xpert Xpress SARS-CoV-2/FLU/RSV plus assay is intended as an aid in the diagnosis of influenza from Nasopharyngeal swab specimens and should not be used as a sole basis for treatment. Nasal washings and aspirates are unacceptable for Xpert Xpress SARS-CoV-2/FLU/RSV testing.  Fact Sheet for Patients: SeriousBroker.it  Fact Sheet for Healthcare Providers: SeriousBroker.it  This test is not yet approved or cleared by the [place] FDA and has been authorized for detection and/or diagnosis of SARS-CoV-2 by FDA under an Emergency Use Authorization (EUA). This EUA will remain in effect (meaning this test can be used) for  the duration of the COVID-19 declaration under Section 564(b)(1) of the Act, 21 U.S.C. section 360bbb-3(b)(1), unless the authorization is terminated or revoked.  Performed at [facility], [address] [place], [place] [address]   Culture, blood (routine x 2)     Status: None (Preliminary result)   Collection Time: 08/23/20  3:33 PM   Specimen: BLOOD  Result Value Ref Range Status   Specimen Description BLOOD BLOOD RIGHT HAND  Final   Special Requests   Final    Blood Culture results may not be optimal due to an inadequate volume of blood received in culture bottles BOTTLES DRAWN AEROBIC AND ANAEROBIC   Culture   Final    NO GROWTH 3 DAYS Performed at [facility], [address] [place], [place] [address]    Report Status PENDING  Incomplete  Culture, blood (routine x 2)     Status: Abnormal (Preliminary result)   Collection Time: 08/23/20  3:33 PM   Specimen: BLOOD  Result Value Ref Range Status   Specimen Description   Final    BLOOD RIGHT ANTECUBITAL Performed at [facility], [address], [place], [place] [address]    Special Requests   Final    Blood Culture results may not be optimal due to an inadequate volume of blood received in culture bottles BOTTLES DRAWN AEROBIC AND ANAEROBIC Performed at [facility], [address] [place], [place] [address]    Culture  Setup Time   Final    ANAEROBIC BOTTLE ONLY GRAM POSITIVE RODS Gram Stain Report Called to,Read Back By and Verified With: P BROWM,[id number]  08/24/20 [facility] Performed at [facility], [address], [place], [place] [address]    Culture ANAEROBIC GRAM POSITIVE RODS (A)  Final   Report Status PENDING  Incomplete  Urine culture     Status: Abnormal   Collection Time: 08/23/20 11:53 PM   Specimen: Urine, Clean Catch  Result Value Ref Range Status   Specimen Description   Final    URINE, CLEAN CATCH Performed at [facility], [address] [place], [place] [address]    Special Requests   Final     NONE Performed at [facility], [address] [place], [place] [address]    Culture MULTIPLE SPECIES PRESENT, SUGGEST RECOLLECTION (A)  Final   Report Status 08/26/2020 FINAL  Final  C Difficile Quick Screen w PCR reflex     Status: None   Collection Time: 08/24/20  3:50 PM   Specimen: STOOL  Result Value Ref Range Status   C Diff antigen NEGATIVE NEGATIVE Final   C Diff toxin NEGATIVE NEGATIVE Final   C Diff interpretation No C. difficile detected.  Final    Comment: Performed at [facility], [address], [place], [place] [address]    Radiology Reports CT ABDOMEN PELVIS WO CONTRAST  Result Date: 08/23/2020 CLINICAL DATA:  Acute abdominal pain. Diffuse pain with diarrhea. Weakness. EXAM: CT ABDOMEN AND PELVIS WITHOUT CONTRAST TECHNIQUE: Multidetector CT imaging of the abdomen and pelvis was performed following the standard protocol without IV contrast. COMPARISON:  None. FINDINGS: Lower chest: Subsegmental atelectasis in the lingula. Mild hypoventilatory change in the dependent lungs. No confluent consolidation or  pleural fluid. Aortic valvular calcifications. Normal heart size. Hepatobiliary: No focal hepatic lesion on noncontrast exam. Mild gallbladder distension but no calcified gallstone. No pericholecystic inflammation. No biliary dilatation. Pancreas: No ductal dilatation or inflammation. Spleen: Normal in size without focal abnormality. Adrenals/Urinary Tract: Adrenal nodule. Slight adrenal thickening. No hydronephrosis. No perinephric edema. Cyst arising from the upper pole of the left kidney measures 7.4 cm and has thin calcified septations. Cyst arising from the lower left kidney measures 5.7 cm without internal septations. Small low-density lesion arising from the mid right kidney is too small to characterize but likely cyst. Calcification in the right renal hilum is felt to be vascular rather than nonobstructing stone. Urinary bladder is partially distended, elevated by prominently  enlarged prostate gland. Stomach/Bowel: Small hiatal hernia. Partially distended stomach. Mild peri pyloric gastric wall thickening, series 2, image 29. No small bowel obstruction or inflammation. There are a few fluid-filled small bowel loops in the pelvis without perienteric inflammation or evidence of wall thickening. Appendix is not definitively seen, no evidence of appendicitis. Nondistended ascending colon which limits assessment for wall thickening. Moderate stool in the transverse, descending, and sigmoid colon. No wall thickening of these colonic segments. Occasional colonic diverticula. There is question of irregular wall thickening involving the right rectosigmoid junction, for example series 2, images 75 and 79. no adjacent inflammation. Vascular/Lymphatic: Moderately advanced aortic atherosclerosis. No aortic aneurysm. Moderate branch atherosclerosis. No enlarged lymph nodes in the abdomen or pelvis. Reproductive: Prostate gland is prominently enlarged spanning 6.6 x 7.4 x 6.8 cm (volume = 170 cm^3). This causes mass effect on the bladder base. Other: No free air or ascites.  No abdominal wall hernia. Musculoskeletal: Diffuse degenerative change throughout the spine with degenerative disc disease and facet hypertrophy. Degenerative change of the pubic symphysis and both hips. Cortical and trabecular thickening involving the left hemipelvis typical of Paget's disease. There may be some cortical thickening involving the posterior right iliac bone as well. IMPRESSION: 1. Questionable irregular wall thickening involving the right rectosigmoid junction. Recommend correlation with physical and direct visualization/colonoscopy to exclude underlying colonic neoplasm. 2. Colonic diverticulosis without diverticulitis. 3. Mild peri pyloric gastric wall thickening, can be seen with gastritis or peptic ulcer disease. Small hiatal hernia. 4. Prominently enlarged prostate gland causing mass effect on the bladder base.  5. Left renal cysts, including a minimally complex cyst arising from the upper pole with a calcified septation. 6. Paget's disease of the left hemipelvis. There may be some cortical thickening involving the posterior right iliac bone as well. Aortic Atherosclerosis (ICD10-I70.0). Electronically Signed   By: Narda Rutherford M.D.   On: 08/23/2020 16:57   DG Chest Portable 1 View  Result Date: 08/23/2020 CLINICAL DATA:  Weakness, chills and diarrhea beginning yesterday. EXAM: PORTABLE CHEST 1 VIEW COMPARISON:  None. FINDINGS: The lungs are clear. Heart size is normal. Aortic atherosclerosis. No pneumothorax or pleural fluid. No acute or focal bony abnormality. IMPRESSION: No acute disease. Aortic Atherosclerosis (ICD10-I70.0). Electronically Signed   By: Drusilla Kanner M.D.   On: 08/23/2020 15:02    SIGNED: Kendell Bane, MD, FHM. [facility],  Pager (please use [email or web address] to page/text) Please use Epic Secure Chat for non-urgent communication (7AM-7PM)  If 7PM-7AM, please contact night-coverage [email or web address], 08/26/2020, 11:00 AM

## 2020-08-26 NOTE — TOC Progression Note (Addendum)
Transition of Care [facility]) - Progression Note    Patient Details  Name: Louis Henderson MRN: [id number] Date of Birth: 11-16-1928  Transition of Care [facility]) CM/SW Contact  Annice Needy, LCSW Phone Number: 08/26/2020, 4:10 PM  Clinical Narrative:    Referral faxed to [facility] and [facility] as he is out of network with [facility].    Expected Discharge Plan: Home w Home Health Services Barriers to Discharge: Continued Medical Work up  Expected Discharge Plan and Services Expected Discharge Plan: Home w Home Health Services     Post Acute Care Choice: Durable Medical Equipment,Home Health Living arrangements for the past 2 months: Single Family Home                 DME Arranged: 3-N-1,Walker rolling,Hospital bed DME Agency: Other - Comment ([facility]) Date DME Agency Contacted: 08/26/20 Time DME Agency Contacted: [phone] Representative spoke with at DME Agency: referral faxed HH Arranged: PT,OT,Social Work [employer] Agency: Other - See comment Date HH Agency Contacted: 08/26/20 Time HH Agency Contacted: [phone] Representative spoke with at [facility] Agency: referral faxed   Social Determinants of Health (SDOH) Interventions    Readmission Risk Interventions No flowsheet data found.

## 2020-08-26 NOTE — Clinical Social Work Note (Signed)
Patient requires frequent re-positioning of the body in ways that cannot be achieved with an  Ordinary bed or wedge pillow, to eliminate pain and the head of the bed needs to be elevated more than 30 degrees most of the time.   Louis Henderson, Louis China, LCSW

## 2020-08-27 LAB — BASIC METABOLIC PANEL
Anion gap: 10 (ref 5–15)
BUN: 17 mg/dL (ref 8–23)
CO2: 24 mmol/L (ref 22–32)
Calcium: 7.9 mg/dL — ABNORMAL LOW (ref 8.9–10.3)
Chloride: 106 mmol/L (ref 98–111)
Creatinine, Ser: 0.97 mg/dL (ref 0.61–1.24)
GFR, Estimated: >60 mL/min (ref >60)
Glucose, Bld: 139 mg/dL — ABNORMAL HIGH (ref 70–99)
Potassium: 3.2 mmol/L — ABNORMAL LOW (ref 3.5–5.1)
Sodium: 140 mmol/L (ref 135–145)

## 2020-08-27 LAB — CBC
HCT: 34.1 % — ABNORMAL LOW (ref 39.0–52.0)
Hemoglobin: 11.3 g/dL — ABNORMAL LOW (ref 13.0–17.0)
MCH: 32.5 pg (ref 26.0–34.0)
MCHC: 33.1 g/dL (ref 30.0–36.0)
MCV: 98 fL (ref 80.0–100.0)
Platelets: 207 10*3/uL (ref 150–400)
RBC: 3.48 MIL/uL — ABNORMAL LOW (ref 4.22–5.81)
RDW: 13.2 % (ref 11.5–15.5)
WBC: 11.1 10*3/uL — ABNORMAL HIGH (ref 4.0–10.5)
nRBC: 0 % (ref 0.0–0.2)

## 2020-08-27 LAB — GLUCOSE, CAPILLARY
Glucose-Capillary: 145 mg/dL — ABNORMAL HIGH (ref 70–99)
Glucose-Capillary: 144 mg/dL — ABNORMAL HIGH (ref 70–99)

## 2020-08-27 MED ORDER — METRONIDAZOLE 500 MG PO TABS: 500.0000 mg | ORAL_TABLET | Freq: Three times a day (TID) | ORAL | 0 refills | Status: AC

## 2020-08-27 MED ORDER — BACID PO TABS: 1.0000 | ORAL_TABLET | Freq: Three times a day (TID) | ORAL | 0 refills | Status: AC

## 2020-08-27 MED ORDER — CIPROFLOXACIN HCL 500 MG PO TABS: 500.0000 mg | ORAL_TABLET | Freq: Two times a day (BID) | ORAL | 0 refills | Status: AC

## 2020-08-27 MED ORDER — CIPROFLOXACIN HCL 500 MG PO TABS: 500.0000 mg | ORAL_TABLET | Freq: Two times a day (BID) | ORAL | 0 refills | Status: DC

## 2020-08-27 NOTE — Progress Notes (Signed)
Patient is expected  to be discharged to home via ems, daughter is accompanying patient, patient currently having lunch, no signs of distress

## 2020-08-27 NOTE — Discharge Summary (Addendum)
Physician Discharge Summary [facility]    Patient: Louis Henderson                   Admit date: 08/23/2020   DOB: 03-31-1929             Discharge date:08/27/2020/11:42 AM ZOX:[id number]                          PCP: Richarda Blade E  Disposition:  HOME with Home Health   Recommendations for Outpatient Follow-up:   . Follow up: in 1 week  Discharge Condition: Stable   Code Status:   Code Status: Full Code  Diet recommendation: Regular healthy diet   Discharge Diagnoses:    Principal Problem:   SIRS (systemic inflammatory response syndrome) (HCC) Active Problems:   AKI (acute kidney injury) (HCC)   Diabetes mellitus type 2 in obese (HCC)   Lactic acidosis   Paget's disease of bone   History of Present Illness/ Hospital Course Charline Bills Summary:   Louis Henderson a89 y.o.male,with hx HTN and DMII with a chief complaint of fever/chills overnight T-max at home 99.8.   Daughter reports that patient is not normally incontinent of his bowels but he was last night. She does report that he has been having diarrhea for several weeks, but she cannot remember how many times in the past week.  She reports watery-like stools 3 or 4 times a day. She says sometimes they are just "squirts." She has not seen any blood or melena.  Also reported chronic cough, with no changes. Patient has had a decreased appetite. She reports that he normally eats small servings, but today he did not eat at all. She does report that he has had darker urine than normal as well. Lastly she reports that he has had generalized weakness.  Patient has poor insight into his health.  Continues to try to leave AGAINST MEDICAL ADVICE, requiring multiple providers to explain to him again why it is best for him to stay.   In the ED Temp 99.7,but goes as high as 102.2,heart rate 100-122, respiratory rate 20-23, blood pressure 129/67, satting at 99% Lactic acid increasing from 1.8-2.4 CT  abdomen shows wall thickening right rectosigmoid junction. Also shows colonic diverticulosis without diverticulitis. Mild peripyloric gastric wall thickening that can be seen with gastritis or peptic ulcer disease. Small hiatal hernia. Prominently enlarged prostate gland causing mass-effect on the bladder base. Left renal cyst including minimally complex cyst arising from the upper pole with a calcified septation. Paget's disease of the left hemipelvis. White blood cell count 14,000, hemoglobin 12.7 Minimal hypokalemia at 3.4 Blood cultures pending Chemistry panel reveals a AKI with a creatinine baseline of 1.2, creatinine today 1.47 Hyperglycemia in the setting of diabetes mellitus type 2 at 174 UA was not suspicious for UTI EKG shows a heart rate of 108, sinus tach  -----------------------------------------------------------------------------------------------------------------------------------------  SIRS criteria positive 1. On admission: patient has a heart rate of 120, respiratory rate of 27, lactate 2.4, white blood cell count 14.0, AKI >>> improved SIRS physiology 2. Sepsis ruled out 3. No source identified yet, suspect intra-abdominal source 4. Lactic acid 2.4, 1.7,  CT abdomen pelvis reviewed: Irregular wall thickening, ?  Enteritis, colonic diverticulosis without diverticulitis,?  Bilateral gastritis... Enlarged prostate with mass-effect, left renal cyst, Paget disease of the left hemipelvis  5. Broad-spectrum cefepime/Vanco and Flagyl, antibiotic was switched to Rocephin and p.o. Flagyl,   6. Blood pressure remained stable status post  IV fluid bolus, resuscitation discontinuing IV fluids now 7. Blood cultures >>> 1/2 cultures from 3/29 grew Clostridium, repeated cultures cultures from 08/26/2020 reported no growth to date 8. Discussed with infectious disease on call Dr. Robert Bellow, based that above might be contaminant, stating the current antibiotic course will  suffice. 9. Stool-C. difficile negative 10. UA not indicative of UTI, but since no source has been identified and abnormality of bladder on CT, urine culture >>>  no growth to date 11. Chest x-ray shows no acute disease 12. CT scan does not show evidence of an infection 13. Much improved symptoms 14. Discontinue vancomycin today 08/25/2020, cefepime, changing Flagyl to p.o. and Cipro. For few more days   AKI -acute kidney injury 1. Baseline creatinine seems to be around 1.2, creatinine today 1.47 >> 1.24 2. Responded to volume resuscitation 3. Monitoring closely, avoiding nephrotoxins 4. Improved  Lactic acidosis 1. Continue to trend lactic acid 2.4 >>1.8. 1.7 >>> 2.9  > 2. Continue to monitor 3. Resolved  Paget's disease of bone 1. May require Ortho consult as outpatient -no acute intervention recommended with his advanced age and comorbidities 2. With pain on CT scan 3. Continue to monitor  Diabetes mellitus type 2 1. Hemoglobin A1c 6.0  2. Home regimen  Severe generalized weakness/debility -PT/OT consult for evaluation--commending SNF patient's daughter is willing to take the patient home with home health -High risk for fall, fall precautions Home health has been arranged   ----------------------------------------------------------------------------------------------------------------------------------------- Nutritional status:  The patient's BMI is: Body mass index is 34.09 kg/m. I agree with the assessment and plan as outlined  Skin Assessment: I have examined the patient's skin and I agree with the wound assessment as performed by wound care team As outlined... Letter heel erythema mild edema-tenderness ---------------------------------------------------------------------------------------------------------------------------------------- Cultures; Blood Cultures x 2 >> 2 blood cultures growing gram-positive rods Urine Culture  >>> NGT     Antimicrobials: IV antibiotics of cefepime/vancomycin/Flagyl 08/27/20 P.o. Flagyl/cipro concluding 10 days of antibiotics   Consultants: None   ----------------------------------------------------------------------------------------------------------------------------------------  Code Status:   Code Status: Full Code  Family Communication: Daughter updated at bedside The above findings and plan of care has been discussed with patient (daughter)  in detail,  they expressed understanding and agreement of above. -Advance care planning has been discussed.   Admission status:   Dispo: The patient is from: Home  Anticipated d/c is to: Home with home health   Discharge Instructions:   Discharge Instructions    Activity as tolerated - No restrictions   Complete by: As directed    Activity as tolerated - No restrictions   Complete by: As directed    Diet - low sodium heart healthy   Complete by: As directed    Discharge instructions   Complete by: As directed    Continue current recommended anabiotics. Aggressive PT/OT, ambulation with assist, fall precautions Increased dairy dietary intake with antibiotics   For home use only DME Hospital bed   Complete by: As directed    Length of Need: 12 Months   Bed type: Semi-electric   Increase activity slowly   Complete by: As directed    Increase activity slowly   Complete by: As directed        Medication List    STOP taking these medications   amLODipine 5 MG tablet Commonly known as: NORVASC   dicyclomine 20 MG tablet Commonly known as: BENTYL   glimepiride 4 MG tablet Commonly known as: AMARYL   losartan-hydrochlorothiazide 100-12.5 MG tablet  Commonly known as: HYZAAR   pantoprazole 40 MG tablet Commonly known as: PROTONIX   ranitidine 150 MG capsule Commonly known as: ZANTAC     TAKE these medications   acetaminophen 500 MG tablet Commonly known as: TYLENOL Take 1,000 mg by mouth  every 8 (eight) hours as needed for moderate pain.   ascorbic acid 1000 MG tablet Commonly known as: VITAMIN C Take 1,000 mg by mouth daily.   aspirin 325 MG EC tablet Take by mouth.   carvedilol 3.125 MG tablet Commonly known as: COREG Take 3.125 mg by mouth daily.   Cholecalciferol 25 MCG (1000 UT) tablet Take 1,000 Units by mouth daily.   ciprofloxacin 500 MG tablet Commonly known as: Cipro Take 1 tablet (500 mg total) by mouth 2 (two) times daily for 3 days.   famotidine 40 MG tablet Commonly known as: PEPCID Take 40 mg by mouth daily.   lactobacillus acidophilus Tabs tablet Take 1 tablet by mouth 3 (three) times daily for 10 days.   latanoprost 0.005 % ophthalmic solution Commonly known as: XALATAN Place 1 drop into the left eye at bedtime.   metFORMIN 500 MG tablet Commonly known as: GLUCOPHAGE Take 1,000 mg by mouth 2 (two) times daily with a meal.   metroNIDAZOLE 500 MG tablet Commonly known as: FLAGYL Take 1 tablet (500 mg total) by mouth every 8 (eight) hours for 10 days.   nystatin-triamcinolone cream Commonly known as: MYCOLOG II APPLY 1 APPLICATION TOPICALLY AA BID PRN   ondansetron 4 MG disintegrating tablet Commonly known as: ZOFRAN-ODT Take 4 mg by mouth every 8 (eight) hours as needed for nausea or vomiting.   potassium chloride 10 MEQ CR capsule Commonly known as: MICRO-K Take 1 capsule by mouth daily.   pyridoxine 100 MG tablet Commonly known as: B-6 Take 100 mg by mouth daily.   simvastatin 20 MG tablet Commonly known as: ZOCOR Take 20 mg by mouth daily at 6 PM.   traZODone 50 MG tablet Commonly known as: DESYREL Take 50 mg by mouth at bedtime.       No Known Allergies   Procedures /Studies:   CT ABDOMEN PELVIS WO CONTRAST  Result Date: 08/23/2020 CLINICAL DATA:  Acute abdominal pain. Diffuse pain with diarrhea. Weakness. EXAM: CT ABDOMEN AND PELVIS WITHOUT CONTRAST TECHNIQUE: Multidetector CT imaging of the abdomen and  pelvis was performed following the standard protocol without IV contrast. COMPARISON:  None. FINDINGS: Lower chest: Subsegmental atelectasis in the lingula. Mild hypoventilatory change in the dependent lungs. No confluent consolidation or pleural fluid. Aortic valvular calcifications. Normal heart size. Hepatobiliary: No focal hepatic lesion on noncontrast exam. Mild gallbladder distension but no calcified gallstone. No pericholecystic inflammation. No biliary dilatation. Pancreas: No ductal dilatation or inflammation. Spleen: Normal in size without focal abnormality. Adrenals/Urinary Tract: Adrenal nodule. Slight adrenal thickening. No hydronephrosis. No perinephric edema. Cyst arising from the upper pole of the left kidney measures 7.4 cm and has thin calcified septations. Cyst arising from the lower left kidney measures 5.7 cm without internal septations. Small low-density lesion arising from the mid right kidney is too small to characterize but likely cyst. Calcification in the right renal hilum is felt to be vascular rather than nonobstructing stone. Urinary bladder is partially distended, elevated by prominently enlarged prostate gland. Stomach/Bowel: Small hiatal hernia. Partially distended stomach. Mild peri pyloric gastric wall thickening, series 2, image 29. No small bowel obstruction or inflammation. There are a few fluid-filled small bowel loops in the pelvis without perienteric inflammation  or evidence of wall thickening. Appendix is not definitively seen, no evidence of appendicitis. Nondistended ascending colon which limits assessment for wall thickening. Moderate stool in the transverse, descending, and sigmoid colon. No wall thickening of these colonic segments. Occasional colonic diverticula. There is question of irregular wall thickening involving the right rectosigmoid junction, for example series 2, images 75 and 79. no adjacent inflammation. Vascular/Lymphatic: Moderately advanced aortic  atherosclerosis. No aortic aneurysm. Moderate branch atherosclerosis. No enlarged lymph nodes in the abdomen or pelvis. Reproductive: Prostate gland is prominently enlarged spanning 6.6 x 7.4 x 6.8 cm (volume = 170 cm^3). This causes mass effect on the bladder base. Other: No free air or ascites.  No abdominal wall hernia. Musculoskeletal: Diffuse degenerative change throughout the spine with degenerative disc disease and facet hypertrophy. Degenerative change of the pubic symphysis and both hips. Cortical and trabecular thickening involving the left hemipelvis typical of Paget's disease. There may be some cortical thickening involving the posterior right iliac bone as well. IMPRESSION: 1. Questionable irregular wall thickening involving the right rectosigmoid junction. Recommend correlation with physical and direct visualization/colonoscopy to exclude underlying colonic neoplasm. 2. Colonic diverticulosis without diverticulitis. 3. Mild peri pyloric gastric wall thickening, can be seen with gastritis or peptic ulcer disease. Small hiatal hernia. 4. Prominently enlarged prostate gland causing mass effect on the bladder base. 5. Left renal cysts, including a minimally complex cyst arising from the upper pole with a calcified septation. 6. Paget's disease of the left hemipelvis. There may be some cortical thickening involving the posterior right iliac bone as well. Aortic Atherosclerosis (ICD10-I70.0). Electronically Signed   By: Narda RutherfordMelanie  Sanford M.D.   On: 08/23/2020 16:57   DG Chest Portable 1 View  Result Date: 08/23/2020 CLINICAL DATA:  Weakness, chills and diarrhea beginning yesterday. EXAM: PORTABLE CHEST 1 VIEW COMPARISON:  None. FINDINGS: The lungs are clear. Heart size is normal. Aortic atherosclerosis. No pneumothorax or pleural fluid. No acute or focal bony abnormality. IMPRESSION: No acute disease. Aortic Atherosclerosis (ICD10-I70.0). Electronically Signed   By: Drusilla Kannerhomas  Dalessio M.D.   On: 08/23/2020  15:02   DG Foot 2 Views Left  Result Date: 08/26/2020 CLINICAL DATA:  Left foot pain, no reported injury. EXAM: LEFT FOOT - 2 VIEW COMPARISON:  None. FINDINGS: Hallux valgus with mild to moderate first metatarsophalangeal joint osteoarthritis. Midfoot osteoarthritis. Calcaneal spurring. Calcification in the plantar fascia. Probable degenerative changes in the tibiotalar joint. No acute osseous abnormality. IMPRESSION: 1. Midfoot and first metatarsophalangeal joint osteoarthritis with associated hallux valgus. 2. 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 Electronically Signed   By: Leanna BattlesMelinda  Blietz M.D.   On: 08/26/2020 13:37    Subjective:   Patient was seen and examined 08/27/2020, 11:42 AM Patient stable today. No acute distress.  No issues overnight Stable for discharge.  Discharge Exam:    Vitals:   08/27/20 0226 08/27/20 0526 08/27/20 0656 08/27/20 0845  BP: (!) 156/83 (!) 150/75 (!) 144/69 (!) 161/83  Pulse: (!) 104 92 98 (!) 103  Resp: 19 19 20    Temp: 98.1 F (36.7 C) 98.5 F (36.9 C) 99.1 F (37.3 C) 98.6 F (37 C)  TempSrc:   Oral Oral  SpO2: 93% 98% 98% 98%  Weight:      Height:        General: Pt lying comfortably in bed & appears in no obvious distress. Cardiovascular: S1 & S2 heard, RRR, S1/S2 +. No murmurs, rubs, gallops or clicks. No JVD or pedal edema. Respiratory: Clear to auscultation without  wheezing, rhonchi or crackles. No increased work of breathing. Abdominal:  Non-distended, non-tender & soft. No organomegaly or masses appreciated. Normal bowel sounds heard. CNS: Alert and oriented. No focal deficits. Extremities: no edema, no cyanosis      The results of significant diagnostics from this hospitalization (including imaging, microbiology, ancillary and laboratory) are listed below for reference.      Microbiology:   Recent Results (from the past 240 hour(s))  Resp Panel by RT-PCR (Flu A&B, Covid) Nasopharyngeal Swab      Status: None   Collection Time: 08/23/20  2:11 PM   Specimen: Nasopharyngeal Swab; Nasopharyngeal(NP) swabs in vial transport medium  Result Value Ref Range Status   SARS Coronavirus 2 by RT PCR NEGATIVE NEGATIVE Final    Comment: (NOTE) SARS-CoV-2 target nucleic acids are NOT DETECTED.  The SARS-CoV-2 RNA is generally detectable in upper respiratory specimens during the acute phase of infection. The lowest concentration of SARS-CoV-2 viral copies this assay can detect is 138 copies/mL. A negative result does not preclude SARS-Cov-2 infection and should not be used as the sole basis for treatment or other patient management decisions. A negative result may occur with  improper specimen collection/handling, submission of specimen other than nasopharyngeal swab, presence of viral mutation(s) within the areas targeted by this assay, and inadequate number of viral copies(<138 copies/mL). A negative result must be combined with clinical observations, patient history, and epidemiological information. The expected result is Negative.  Fact Sheet for Patients:  BloggerCourse.com  Fact Sheet for Healthcare Providers:  SeriousBroker.it  This test is no t yet approved or cleared by the [place] FDA and  has been authorized for detection and/or diagnosis of SARS-CoV-2 by FDA under an Emergency Use Authorization (EUA). This EUA will remain  in effect (meaning this test can be used) for the duration of the COVID-19 declaration under Section 564(b)(1) of the Act, 21 U.S.C.section 360bbb-3(b)(1), unless the authorization is terminated  or revoked sooner.       Influenza A by PCR NEGATIVE NEGATIVE Final   Influenza B by PCR NEGATIVE NEGATIVE Final    Comment: (NOTE) The Xpert Xpress SARS-CoV-2/FLU/RSV plus assay is intended as an aid in the diagnosis of influenza from Nasopharyngeal swab specimens and should not be used as a sole basis  for treatment. Nasal washings and aspirates are unacceptable for Xpert Xpress SARS-CoV-2/FLU/RSV testing.  Fact Sheet for Patients: BloggerCourse.com  Fact Sheet for Healthcare Providers: SeriousBroker.it  This test is not yet approved or cleared by the [place] FDA and has been authorized for detection and/or diagnosis of SARS-CoV-2 by FDA under an Emergency Use Authorization (EUA). This EUA will remain in effect (meaning this test can be used) for the duration of the COVID-19 declaration under Section 564(b)(1) of the Act, 21 U.S.C. section 360bbb-3(b)(1), unless the authorization is terminated or revoked.  Performed at [facility], [address] [place], [place] [address]   Culture, blood (routine x 2)     Status: None (Preliminary result)   Collection Time: 08/23/20  3:33 PM   Specimen: BLOOD  Result Value Ref Range Status   Specimen Description BLOOD BLOOD RIGHT HAND  Final   Special Requests   Final    Blood Culture results may not be optimal due to an inadequate volume of blood received in culture bottles BOTTLES DRAWN AEROBIC AND ANAEROBIC   Culture   Final    NO GROWTH 4 DAYS Performed at [facility], [address], [place], [place] [address]  Report Status PENDING  Incomplete  Culture, blood (routine x 2)     Status: Abnormal   Collection Time: 08/23/20  3:33 PM   Specimen: BLOOD  Result Value Ref Range Status   Specimen Description   Final    BLOOD RIGHT ANTECUBITAL Performed at [facility], [address] [place], [place] [address]    Special Requests   Final    Blood Culture results may not be optimal due to an inadequate volume of blood received in culture bottles BOTTLES DRAWN AEROBIC AND ANAEROBIC Performed at [facility], [address] [place], [place] [address]    Culture  Setup Time   Final    ANAEROBIC BOTTLE ONLY GRAM POSITIVE RODS Gram Stain Report Called to,Read Back By and Verified  With: P BROWM,RN@[id number]  08/24/20 [facility] Performed at [facility], [address] [place], [place] [address]    Culture CLOSTRIDIUM SEPTICUM (A)  Final   Report Status 08/26/2020 FINAL  Final  Urine culture     Status: Abnormal   Collection Time: 08/23/20 11:53 PM   Specimen: Urine, Clean Catch  Result Value Ref Range Status   Specimen Description   Final    URINE, CLEAN CATCH Performed at [facility], [address] [place], [place] [address]    Special Requests   Final    NONE Performed at [facility], [address], [place], [place] [address]    Culture MULTIPLE SPECIES PRESENT, SUGGEST RECOLLECTION (A)  Final   Report Status 08/26/2020 FINAL  Final  C Difficile Quick Screen w PCR reflex     Status: None   Collection Time: 08/24/20  3:50 PM   Specimen: STOOL  Result Value Ref Range Status   C Diff antigen NEGATIVE NEGATIVE Final   C Diff toxin NEGATIVE NEGATIVE Final   C Diff interpretation No C. difficile detected.  Final    Comment: Performed at [facility], [address], [place], [place] [address]     Labs:   CBC: Recent Labs  Lab 08/23/20 1411 08/24/20 0613 08/25/20 0505 08/26/20 0443 08/27/20 0536  WBC 14.1* 16.2* 14.2* 14.1* 11.1*  NEUTROABS 11.7*  --   --   --   --   HGB 12.7* 12.3* 12.1* 11.7* 11.3*  HCT 38.6* 38.5* 36.4* 35.1* 34.1*  MCV 97.7 101.9* 99.2 97.5 98.0  PLT 200 166 156 192 207   Basic Metabolic Panel: Recent Labs  Lab 08/23/20 1411 08/24/20 0613 08/25/20 0505 08/26/20 0443 08/27/20 0536  NA 135 137 140 138 140  K 3.4* 3.1* 3.8 3.4* 3.2*  CL 100 104 106 105 106  CO2 GLUCOSE 174* 133* 97 124* 139*  BUN 25* CREATININE 1.47* 1.17 1.24 1.11 0.97  CALCIUM 8.5* 8.0* 7.9* 7.9* 7.9*  MG  --  1.4*  --   --   --    Liver Function Tests: Recent Labs  Lab 08/23/20 1411 08/24/20 0613  AST 15 21  ALT 9 10  ALKPHOS 80 69  BILITOT 1.2 1.2  PROT 6.8 5.6*  ALBUMIN 3.4* 2.8*   CBG: Recent Labs  Lab  08/26/20 1136 08/26/20 1617 08/26/20 2058 08/27/20 0728 08/27/20 1113  GLUCAP 157* 150* 168* 145* 144*   Urinalysis    Component Value Date/Time   COLORURINE YELLOW 08/23/2020 1407   APPEARANCEUR CLOUDY (A) 08/23/2020 1407   LABSPEC 1.020 08/23/2020 1407   PHURINE 5.0 08/23/2020 1407   GLUCOSEU NEGATIVE 08/23/2020 1407  HGBUR SMALL (A) 08/23/2020 1407   BILIRUBINUR NEGATIVE 08/23/2020 1407   KETONESUR 5 (A) 08/23/2020 1407   PROTEINUR 30 (A) 08/23/2020 1407   NITRITE NEGATIVE 08/23/2020 1407   LEUKOCYTESUR NEGATIVE 08/23/2020 1407         Time coordinating discharge: Over 45 minutes  SIGNED: Kendell Bane, MD, FACP, [facility]. [facility],  Please use [email or web address] to Page If 7PM-7AM, please contact night-coverage [email or web address] 08/27/2020, 11:42 AM

## 2020-08-27 NOTE — TOC Progression Note (Signed)
Transition of Care [facility]) - Progression Note    Patient Details  Name: Louis Henderson MRN: [id number] Date of Birth: July 09, 1928  Transition of [facility] CM/SW Contact  Barry Brunner, LCSW Phone Number: 08/27/2020, 2:00 PM  Clinical Narrative:    The patient requires positioning of the body in ways not feasible with an ordinary bed in order to alleviate pain.    Expected Discharge Plan: Home w Home Health Services Barriers to Discharge: Continued Medical Work up  Expected Discharge Plan and Services Expected Discharge Plan: Home w Home Health Services     Post Acute Care Choice: Durable Medical Equipment,Home Health Living arrangements for the past 2 months: Single Family Home Expected Discharge Date: 08/27/20               DME Arranged: 3-N-1,Hospital bed,Walker rolling DME Agency: [employer] Date DME Agency Contacted: 08/26/20 Time DME Agency Contacted: [phone] Representative spoke with at DME Agency: referral faxed HH Arranged: RN,PT HH Agency: [facility] Date [facility] Agency Contacted: 08/27/20 Time HH Agency Contacted: [phone] Representative spoke with at [facility] Agency: referral faxed   Social Determinants of Health (SDOH) Interventions    Readmission Risk Interventions No flowsheet data found.

## 2020-08-27 NOTE — Discharge Instructions (Signed)
Dehydration, Adult Dehydration is condition in which there is not enough water or other fluids in the body. This happens when a person loses more fluids than he or she takes in. Important body parts cannot work right without the right amount of fluids. Any loss of fluids from the body can cause dehydration. Dehydration can be mild, worse, or very bad. It should be treated right away to keep it from getting very bad. What are the causes? This condition may be caused by:  Conditions that cause loss of water or other fluids, such as: ? Watery poop (diarrhea). ? Vomiting. ? Sweating a lot. ? Peeing (urinating) a lot.  Not drinking enough fluids, especially when you: ? Are ill. ? Are doing things that take a lot of energy to do.  Other illnesses and conditions, such as fever or infection.  Certain medicines, such as medicines that take extra fluid out of the body (diuretics).  Lack of safe drinking water.  Not being able to get enough water and food. What increases the risk? The following factors may make you more likely to develop this condition:  Having a long-term (chronic) illness that has not been treated the right way, such as: ? Diabetes. ? Heart disease. ? Kidney disease.  Being 65 years of age or older.  Having a disability.  Living in a place that is high above the ground or sea (high in altitude). The thinner, dried air causes more fluid loss.  Doing exercises that put stress on your body for a long time. What are the signs or symptoms? Symptoms of dehydration depend on how bad it is. Mild or worse dehydration  Thirst.  Dry lips or dry mouth.  Feeling dizzy or light-headed, especially when you stand up from sitting.  Muscle cramps.  Your body making: ? Dark pee (urine). Pee may be the color of tea. ? Less pee than normal. ? Less tears than normal.  Headache. Very bad dehydration  Changes in skin. Skin may: ? Be cold to the touch (clammy). ? Be blotchy  or pale. ? Not go back to normal right after you lightly pinch it and let it go.  Little or no tears, pee, or sweat.  Changes in vital signs, such as: ? Fast breathing. ? Low blood pressure. ? Weak pulse. ? Pulse that is more than 100 beats a minute when you are sitting still.  Other changes, such as: ? Feeling very thirsty. ? Eyes that look hollow (sunken). ? Cold hands and feet. ? Being mixed up (confused). ? Being very tired (lethargic) or having trouble waking from sleep. ? Short-term weight loss. ? Loss of consciousness. How is this treated? Treatment for this condition depends on how bad it is. Treatment should start right away. Do not wait until your condition gets very bad. Very bad dehydration is an emergency. You will need to go to a hospital.  Mild or worse dehydration can be treated at home. You may be asked to: ? Drink more fluids. ? Drink an oral rehydration solution (ORS). This drink helps get the right amounts of fluids and salts and minerals in the blood (electrolytes).  Very bad dehydration can be treated: ? With fluids through an IV tube. ? By getting normal levels of salts and minerals in your blood. This is often done by giving salts and minerals through a tube. The tube is passed through your nose and into your stomach. ? By treating the root cause. Follow these instructions at   home: Oral rehydration solution If told by your doctor, drink an ORS:  Make an ORS. Use instructions on the package.  Start by drinking small amounts, about  cup (120 mL) every 5-10 minutes.  Slowly drink more until you have had the amount that your doctor said to have. Eating and drinking  Drink enough clear fluid to keep your pee pale yellow. If you were told to drink an ORS, finish the ORS first. Then, start slowly drinking other clear fluids. Drink fluids such as: ? Water. Do not drink only water. Doing that can make the salt (sodium) level in your body get too low. ? Water  from ice chips you suck on. ? Fruit juice that you have added water to (diluted). ? Low-calorie sports drinks.  Eat foods that have the right amounts of salts and minerals, such as: ? Bananas. ? Oranges. ? Potatoes. ? Tomatoes. ? Spinach.  Do not drink alcohol.  Avoid: ? Drinks that have a lot of sugar. These include:  High-calorie sports drinks.  Fruit juice that you did not add water to.  Soda.  Caffeine. ? Foods that are greasy or have a lot of fat or sugar.         General instructions  Take over-the-counter and prescription medicines only as told by your doctor.  Do not take salt tablets. Doing that can make the salt level in your body get too high.  Return to your normal activities as told by your doctor. Ask your doctor what activities are safe for you.  Keep all follow-up visits as told by your doctor. This is important. Contact a doctor if:  You have pain in your belly (abdomen) and the pain: ? Gets worse. ? Stays in one place.  You have a rash.  You have a stiff neck.  You get angry or annoyed (irritable) more easily than normal.  You are more tired or have a harder time waking than normal.  You feel: ? Weak or dizzy. ? Very thirsty. Get help right away if you have:  Any symptoms of very bad dehydration.  Symptoms of vomiting, such as: ? You cannot eat or drink without vomiting. ? Your vomiting gets worse or does not go away. ? Your vomit has blood or green stuff in it.  Symptoms that get worse with treatment.  A fever.  A very bad headache.  Problems with peeing or pooping (having a bowel movement), such as: ? Watery poop that gets worse or does not go away. ? Blood in your poop (stool). This may cause poop to look black and tarry. ? Not peeing in 6-8 hours. ? Peeing only a small amount of very dark pee in 6-8 hours.  Trouble breathing. These symptoms may be an emergency. Do not wait to see if the symptoms will go away. Get  medical help right away. Call your local emergency services (911 in the [place] Do not drive yourself to the hospital. Summary  Dehydration is a condition in which there is not enough water or other fluids in the body. This happens when a person loses more fluids than he or she takes in.  Treatment for this condition depends on how bad it is. Treatment should be started right away. Do not wait until your condition gets very bad.  Drink enough clear fluid to keep your pee pale yellow. If you were told to drink an oral rehydration solution (ORS), finish the ORS first. Then, start slowly drinking other clear fluids.    Take over-the-counter and prescription medicines only as told by your doctor.  Get help right away if you have any symptoms of very bad dehydration. This information is not intended to replace advice given to you by your health care provider. Make sure you discuss any questions you have with your health care provider. Document Revised: 12/25/2018 Document Reviewed: 12/25/2018 Elsevier Patient Education  2021 [employer]   Deconditioning Deconditioning refers to the changes in the body that occur during a period of inactivity. The changes happen in the heart, lungs, and muscles. They make you feel tired and weak (fatigued) and decrease your ability to be active. The three stages of deconditioning include:  Mild deconditioning. This is a change in your ability to do your usual exercise activities, such as running, biking, or swimming.  Moderate deconditioning. This is a change in your ability to do normal everyday activities, such as walking, shopping for groceries, and doing chores.  Severe deconditioning. In this stage, you may not be able to do minimal activity or usual self-care. What are the causes? Deconditioning can occur after only a few days of inactivity. The longer the period of inactivity, the more severe the deconditioning will be, and the longer it will take to  return to your previous level of functioning. Deconditioning is often caused by inactivity due to:  Illnesses, such as cancer, stroke, heart attack, fibromyalgia, and chronic fatigue syndrome.  Injuries, especially back injuries, broken bones, and injuries to soft tissues, such as ligaments and tendons.  A long stay in the hospital.  Pregnancy, especially if long periods of bed rest are needed. What increases the risk? The following factors may make you more likely to develop this condition:  Staying in the hospital or being on bed rest.  Obesity.  Poor nutrition.  Being an older adult.  Having an injury or illness that affects your movement and activity. What are the signs or symptoms? Symptoms of this condition include:  Weakness and tiredness.  Shortness of breath with minor physical effort (exertion).  A heartbeat that is faster than normal. You may not notice this without taking your pulse.  Pain or discomfort with activity.  Decreased strength, endurance, and balance.  Difficulty doing your usual forms of exercise.  Difficulty doing activities of daily living, such as grocery shopping or chores. You may also have problems walking around the house and doing basic self-care, such as getting to the bathroom, preparing meals, or doing laundry. How is this diagnosed? This condition is diagnosed based on your medical history and a physical exam. During the physical exam, your health care provider will check for signs of deconditioning, such as:  Decreased size of muscles.  Decreased strength.  Trouble with balance.  Shortness of breath or a heart rate that is faster than normal after minor exertion. How is this treated? Treatment for this condition involves an exercise program in which activity is increased slowly. Your health care provider will tell you which exercises are right for you. The exercise program will likely include:  Aerobic exercise. This type of  exercise helps improve the functioning of the heart, lungs, and muscles.  Strength training. This type of exercise helps increase muscle size and strength. Both of these types of exercise will improve your endurance. You may be referred to a physical therapist who can create a safe strengthening program for you to follow. Follow these instructions at home: Eating and drinking  Eat a healthy, well-balanced diet. This includes: ? Proteins, such  as lean meats and fish, to build muscles. ? Fresh fruits and vegetables. ? Carbohydrates, such as whole grains, to boost energy.  Drink enough fluid to keep your urine pale yellow.   Activity  Follow the exercise program that is recommended by your health care provider or physical therapist.  Do not increase your exercise any faster than directed.   General instructions  Take over-the-counter and prescription medicines only as told by your health care provider.  Do not use any products that contain nicotine or tobacco, such as cigarettes, e-cigarettes, and chewing tobacco. If you need help quitting, ask your health care provider.  Keep all follow-up visits as told by your health care provider. This is important. Contact a health care provider if:  You are not able to do the recommended exercise program.  You are becoming more and more tired and weak.  You become light-headed when rising to a sitting or standing position.  Your level of endurance decreases after it has improved. Get help right away if you:  Have chest pain.  Are very short of breath.  Have any episodes of fainting. Summary  Deconditioning refers to the changes in the body that occur during a period of inactivity.  Deconditioning happens in the heart, lungs, and muscles. The changes make you feel tired and weak and decrease your ability to be active.  Treatment for deconditioning involves an exercise program in which activity is increased slowly. This information is  not intended to replace advice given to you by your health care provider. Make sure you discuss any questions you have with your health care provider. Document Revised: 10/09/2018 Document Reviewed: 10/09/2018 Elsevier Patient Education  2021 [employer].

## 2020-08-27 NOTE — TOC Transition Note (Addendum)
Transition of Care [facility]) - CM/SW Discharge Note   Patient Details  Name: Louis Henderson MRN: [id number] Date of Birth: 1929-03-27  [facility] CM/SW Contact:  Barry Brunner, LCSW Phone Number: 08/27/2020, 12:15 PM   Clinical Narrative:    CSW contacted [employer] company to verify if equipment had been received. [employer] D[employer] Supervisor reported that order had not been received and that if order placed today, it would not be reviewed until Monday. CSW placed DME order with [employer]. [employer] agreeable to provide DME equipment and deliver it on 08/27/2020. CSW. CSW notified [employer] HH answering service of discharge. CSW notified of patient's need for ambulance transport. CSW completed med necessity and contacted EMS. TOC signing off.    Final next level of care: Home w Home Health Services Barriers to Discharge: Continued Medical Work up   Patient Goals and CMS Choice Patient states their goals for this hospitalization and ongoing recovery are:: Return home CMS Medicare.gov Compare Post Acute Care list provided to:: Patient Choice offered to / list presented to : Patient  Discharge Placement                    Patient and family notified of of transfer: 08/27/20  Discharge Plan and Services     Post Acute Care Choice: Durable Medical Equipment,Home Health          DME Arranged: 3-N-1,Hospital bed,Walker rolling DME Agency: [employer] Date DME Agency Contacted: 08/26/20 Time DME Agency Contacted: [phone] Representative spoke with at DME Agency: referral faxed HH Arranged: RN,PT HH Agency: [facility] Center Date [facility] Agency Contacted: 08/27/20 Time HH Agency Contacted: 1214 Representative spoke with at [facility] Agency: referral faxed  Social Determinants of Health (SDOH) Interventions     Readmission Risk Interventions No flowsheet data found.

## 2020-08-28 LAB — CULTURE, BLOOD (ROUTINE X 2): Culture: NO GROWTH

## 2020-09-04 ENCOUNTER — Encounter (HOSPITAL_COMMUNITY): Payer: Self-pay | Admitting: Emergency Medicine

## 2020-09-04 ENCOUNTER — Other Ambulatory Visit: Payer: Self-pay

## 2020-09-04 ENCOUNTER — Emergency Department (HOSPITAL_COMMUNITY)
Admission: EM | Admit: 2020-09-04 | Discharge: 2020-09-04 | Disposition: A | Discharge status: Home / Self Care | Payer: Medicare Other | Attending: Emergency Medicine | Admitting: Emergency Medicine

## 2020-09-04 DIAGNOSIS — Z7984 Long term (current) use of oral hypoglycemic drugs: Secondary | ICD-10-CM | POA: Insufficient documentation

## 2020-09-04 DIAGNOSIS — E119 Type 2 diabetes mellitus without complications: Secondary | ICD-10-CM | POA: Insufficient documentation

## 2020-09-04 DIAGNOSIS — Z79899 Other long term (current) drug therapy: Secondary | ICD-10-CM | POA: Insufficient documentation

## 2020-09-04 DIAGNOSIS — R339 Retention of urine, unspecified: Secondary | ICD-10-CM | POA: Insufficient documentation

## 2020-09-04 DIAGNOSIS — I10 Essential (primary) hypertension: Secondary | ICD-10-CM | POA: Insufficient documentation

## 2020-09-04 HISTORY — DX: Unspecified hearing loss, unspecified ear: H91.90

## 2020-09-04 LAB — URINALYSIS, MICROSCOPIC (REFLEX): Bacteria, UA: NONE SEEN

## 2020-09-04 LAB — URINALYSIS, ROUTINE W REFLEX MICROSCOPIC
Bilirubin Urine: NEGATIVE
Glucose, UA: NEGATIVE mg/dL
Ketones, ur: NEGATIVE mg/dL
Leukocytes,Ua: NEGATIVE
Nitrite: NEGATIVE
Protein, ur: NEGATIVE mg/dL
Specific Gravity, Urine: 1.01 (ref 1.005–1.030)
pH: 5.5 (ref 5.0–8.0)

## 2020-09-04 NOTE — ED Triage Notes (Signed)
Pt c/o urinary retention since midnight.

## 2020-09-04 NOTE — ED Provider Notes (Signed)
[facility] EMERGENCY DEPARTMENT Provider Note   CSN: [id number] Arrival date & time: 09/04/20  1754     History Chief Complaint  Patient presents with  . Urinary Retention    Louis Henderson is a 87 y.o. male with a history of diabetes, BPH, hypertension, presenting to emergency department with difficulty urinating.  Patient ports he does not feel he has been able to urinate for 2 to 3 days.  His family performed in and out cath with no urine return earlier today.  The patient does have suprapubic pressure and feels like his bladder is very full.  He says happened him many years in the past.  He denies fevers or chills.  HPI     Past Medical History:  Diagnosis Date  . Diabetes mellitus without complication (HCC)   . Hard of hearing   . Hypertension     Patient Active Problem List   Diagnosis Date Noted  . AKI (acute kidney injury) (HCC) 08/23/2020  . SIRS (systemic inflammatory response syndrome) (HCC) 08/23/2020  . Diabetes mellitus type 2 in obese (HCC) 08/23/2020  . Lactic acidosis 08/23/2020  . 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 APPENDECTOMY         History reviewed. No pertinent family history.  Social History   Tobacco Use  . Smoking status: Never Smoker  . Smokeless tobacco: Never Used  Vaping Use  . Vaping Use: Never used  Substance Use Topics  . Alcohol use: Not Currently  . Drug use: Not Currently    Home Medications Prior to Admission medications   Medication Sig Start Date End Date Taking? Authorizing Provider  acetaminophen (TYLENOL) 500 MG tablet Take 1,000 mg by mouth every 8 (eight) hours as needed for moderate pain.    [provider]  ascorbic acid (VITAMIN C) 1000 MG tablet Take 1,000 mg by mouth daily.    [provider]  aspirin 325 MG EC tablet Take by mouth.    [provider]  carvedilol (COREG) 3.125 MG tablet Take 3.125 mg by mouth daily.  02/08/20 02/07/21  [provider]  Cholecalciferol 25 MCG (1000 UT) tablet Take 1,000 Units by mouth daily.    [provider]  famotidine (PEPCID) 40 MG tablet Take 40 mg by mouth daily.    [provider]  lactobacillus acidophilus (BACID) TABS tablet Take 1 tablet by mouth 3 (three) times daily for 10 days. 08/27/20 09/06/20  Shahmehdi, Gemma Payor, MD  latanoprost (XALATAN) 0.005 % ophthalmic solution Place 1 drop into the left eye at bedtime. 05/14/19   [provider]  metFORMIN (GLUCOPHAGE) 500 MG tablet Take 1,000 mg by mouth 2 (two) times daily with a meal.    [provider]  metroNIDAZOLE (FLAGYL) 500 MG tablet Take 1 tablet (500 mg total) by mouth every 8 (eight) hours for 10 days. 08/27/20 09/06/20  Kendell Bane, MD  nystatin-triamcinolone (MYCOLOG II) cream APPLY 1 APPLICATION TOPICALLY AA BID PRN 08/27/17   [provider]  ondansetron (ZOFRAN-ODT) 4 MG disintegrating tablet Take 4 mg by mouth every 8 (eight) hours as needed for nausea or vomiting. 05/05/20   [provider]  potassium chloride (MICRO-K) 10 MEQ CR capsule Take 1 capsule by mouth daily. 08/27/17   [provider]  pyridoxine (B-6) 100 MG tablet Take 100 mg by mouth daily.    [provider]  simvastatin (ZOCOR) 20 MG tablet Take 20 mg  by mouth daily at 6 PM.    [provider]  traZODone (DESYREL) 50 MG tablet Take 50 mg by mouth at bedtime.    [provider]    Allergies    Patient has no known allergies.  Review of Systems   Review of Systems  Constitutional: Negative for chills and fever.  HENT: Negative for ear pain and sore throat.   Eyes: Negative for pain and visual disturbance.  Respiratory: Negative for cough and shortness of breath.   Cardiovascular: Negative for chest pain and palpitations.  Gastrointestinal: Negative for abdominal pain and vomiting.  Genitourinary: Positive for difficulty urinating. Negative  for dysuria.  Musculoskeletal: Negative for arthralgias and back pain.  Skin: Negative for color change and rash.  Neurological: Negative for syncope and light-headedness.  All other systems reviewed and are negative.   Physical Exam Updated Vital Signs BP 123/73   Pulse 90   Temp 98 F (36.7 C) (Oral)   Resp 20   Ht 5\' 3"  (1.6 m)   Wt 87.3 kg   SpO2 99%   BMI 34.09 kg/m   Physical Exam Constitutional:      General: He is not in acute distress. HENT:     Head: Normocephalic and atraumatic.  Eyes:     Conjunctiva/sclera: Conjunctivae normal.     Pupils: Pupils are equal, round, and reactive to light.  Cardiovascular:     Rate and Rhythm: Normal rate and regular rhythm.  Pulmonary:     Effort: Pulmonary effort is normal. No respiratory distress.  Abdominal:     General: There is no distension.     Tenderness: There is abdominal tenderness in the suprapubic area.  Skin:    General: Skin is warm and dry.  Neurological:     General: No focal deficit present.     Mental Status: He is alert. Mental status is at baseline.  Psychiatric:        Mood and Affect: Mood normal.        Behavior: Behavior normal.     ED Results / Procedures / Treatments   Labs (all labs ordered are listed, but only abnormal results are displayed) Labs Reviewed  URINALYSIS, ROUTINE W REFLEX MICROSCOPIC - Abnormal; Notable for the following components:      Result Value   APPearance HAZY (*)    Hgb urine dipstick LARGE (*)    All other components within normal limits  URINALYSIS, MICROSCOPIC (REFLEX)    EKG None  Radiology No results found.  Procedures Procedures   Medications Ordered in ED Medications - No data to display  ED Course  I have reviewed the triage vital signs and the nursing notes.  Pertinent labs & imaging results that were available during my care of the patient were reviewed by me and considered in my medical decision making (see chart for details).  87 yo  male here with urinary retention Foley catheter placed, 900 cc urine voided, significant relief of bladder distension No evident UTI May be 2/2 BPH or other condition Advised PCP and urology follow up  Clinical Course as of 09/05/20 0950  Sun Sep 04, 2020  2040 Foley placed, 900 cc urine drained.  No evidence of UTI.  Likely BPH.  Will maintain foley for now, advised needing urology or PCP follow up and foley removal or replacement within2  weeks.  Family here to get him [MT]    Clinical Course User Index [MT] Donnita Farina, Sep 06, 2020, MD   Final  Clinical Impression(s) / ED Diagnoses Final diagnoses:  Urinary retention    Rx / DC Orders ED Discharge Orders    None       Zamiyah Resendes, Kermit Balo, MD 09/05/20 [phone]

## 2020-09-04 NOTE — ED Notes (Signed)
Family performed an  In and out cath at 1100 with no urine return.

## 2020-09-04 NOTE — Discharge Instructions (Signed)
The foley bag should be emptied when full.   The catheter should be exchanged or replaced after 2 weeks.  Please follow up with Blas's urologist, or if he doesn't have one, make an appointment at the urology clinic.

## 2020-11-25 DEATH — deceased

## 2022-04-05 IMAGING — CT CT ABD-PELV W/O CM
2 of 4 series · 15 of 46 positions shown, 17 images · non-contrast
Comparison: None.

CLINICAL DATA: Acute abdominal pain. Diffuse pain with diarrhea.
Weakness.

EXAM:
CT ABDOMEN AND PELVIS WITHOUT CONTRAST
TECHNIQUE: Multidetector CT imaging of the abdomen and pelvis was performed
following the standard protocol without IV contrast.

[Series 2: axial st · axial · 0.93mm/px · z∈[+611,+1021]mm · 12 of 94 slices shown, 14 images]
[im 6/94  soft-tissue]
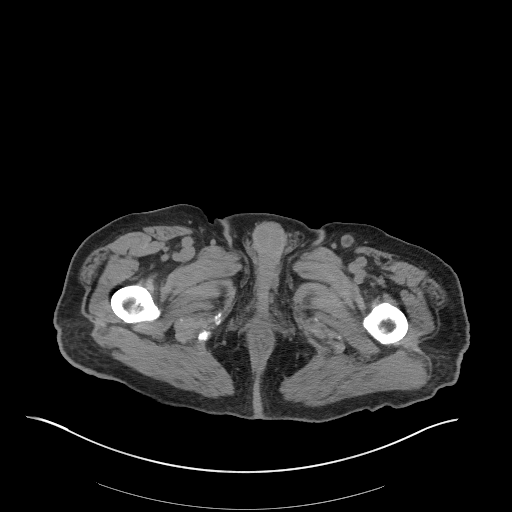
[im 6/94  bone]
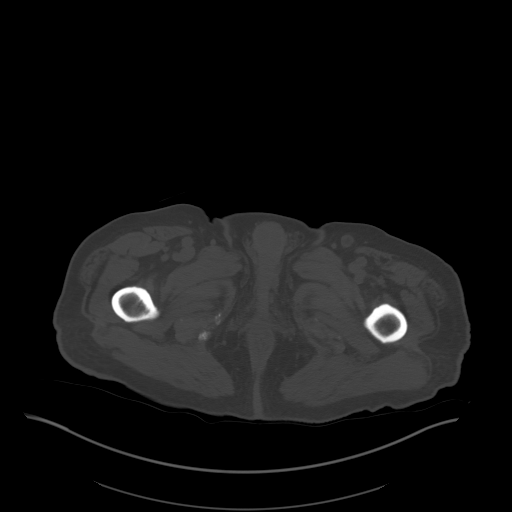
[im 12/94  soft-tissue]
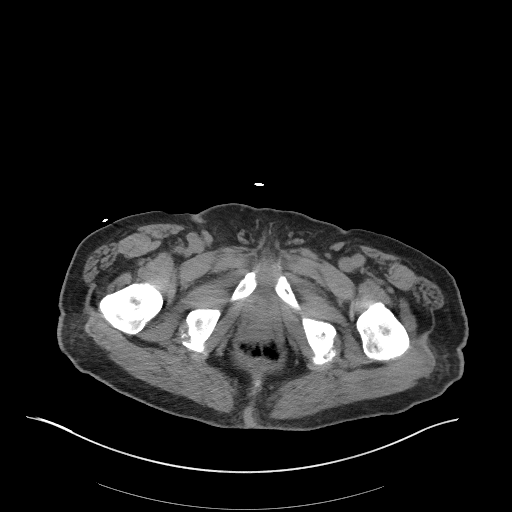
[im 24/94  soft-tissue]
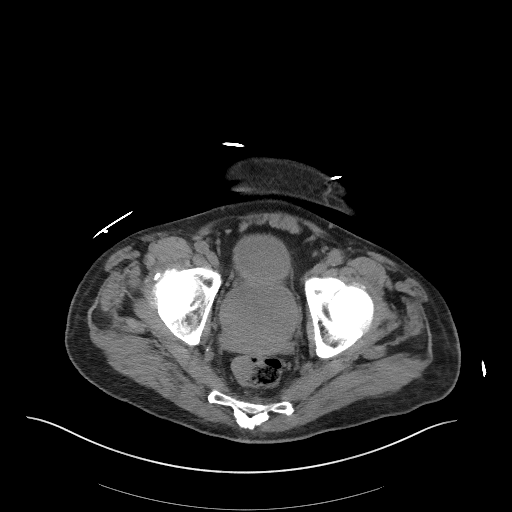
[im 30/94  soft-tissue]
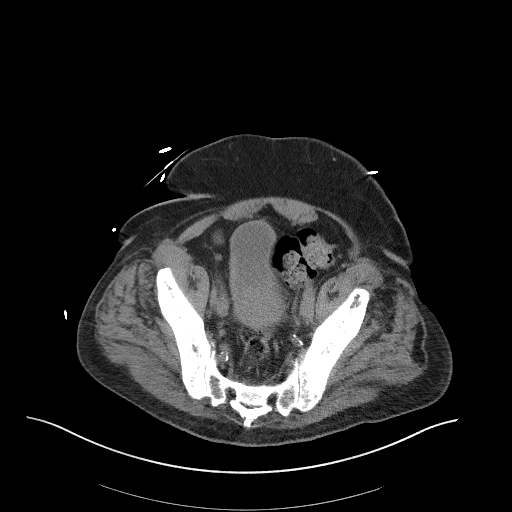
[im 35/94  soft-tissue]
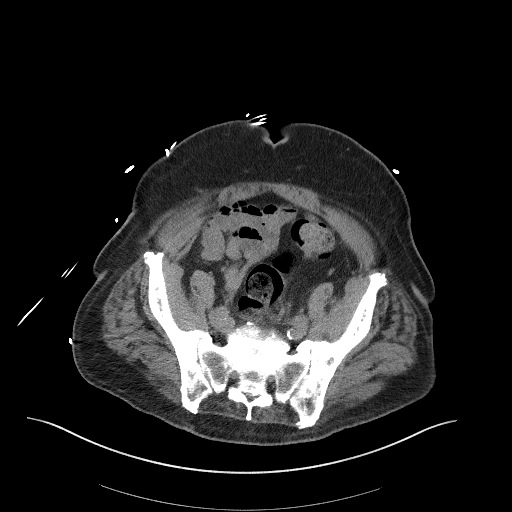
[im 41/94  soft-tissue]
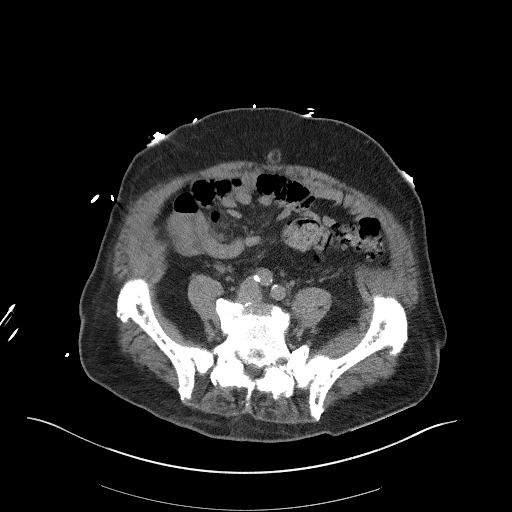
[im 53/94  soft-tissue]
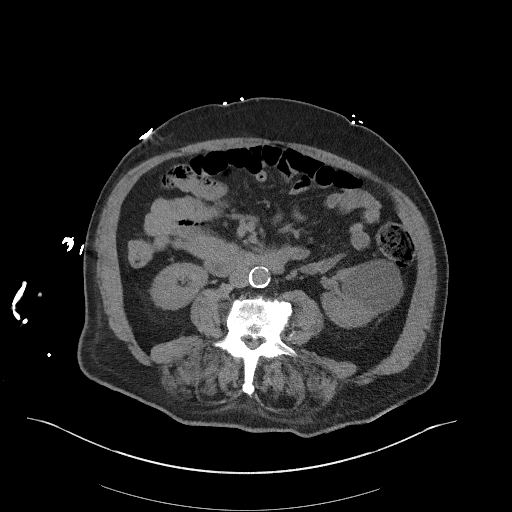
[im 59/94  soft-tissue]
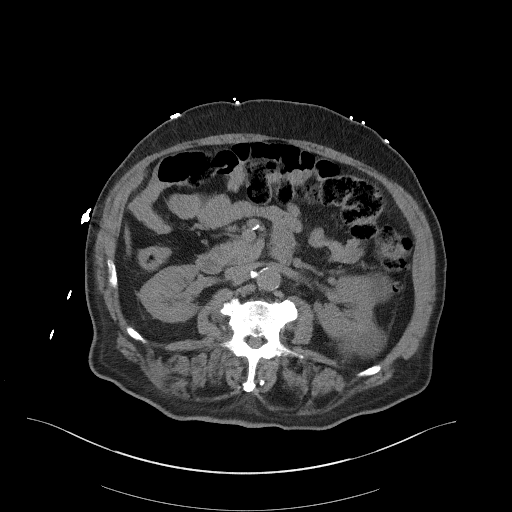
[im 64/94  soft-tissue]
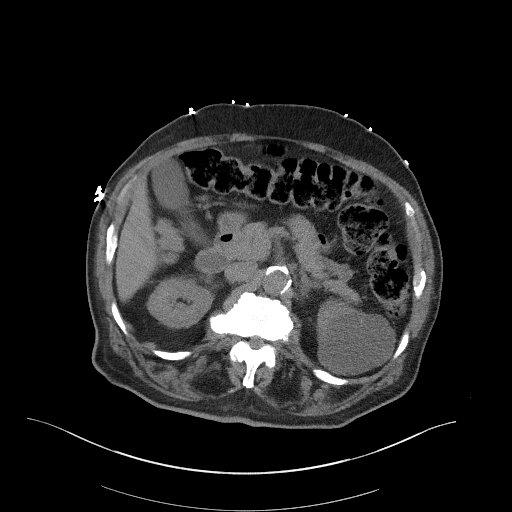
[im 64/94  bone]
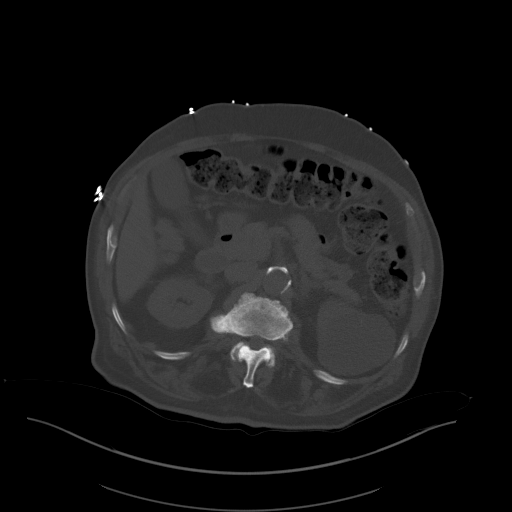
[im 70/94  soft-tissue]
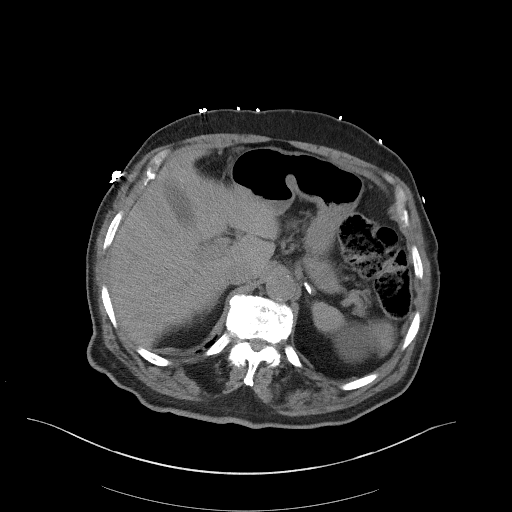
[im 82/94  soft-tissue]
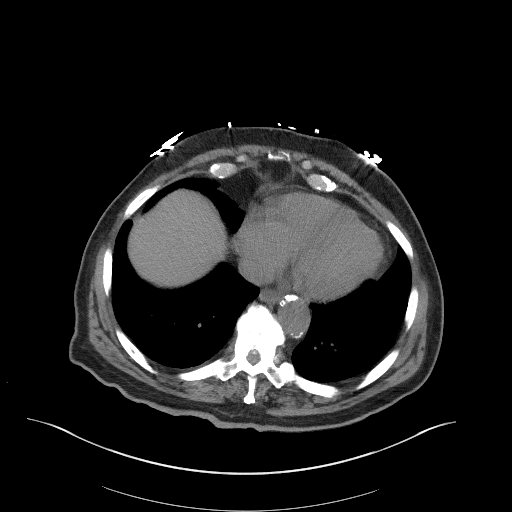
[im 88/94  soft-tissue]
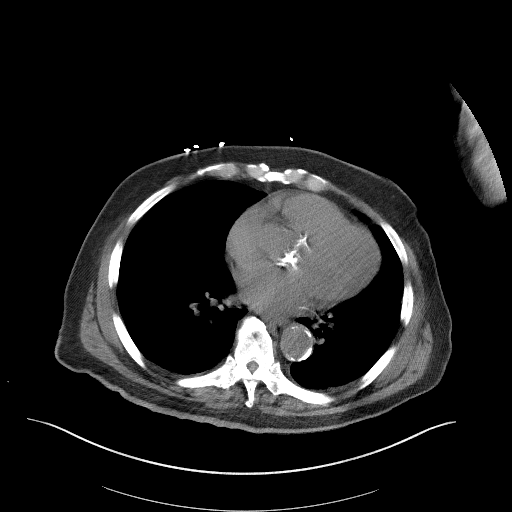

[Series 5: coronal st · coronal · 0.78mm/px · 3 of 120 slices shown]
[im 40/120  soft-tissue]
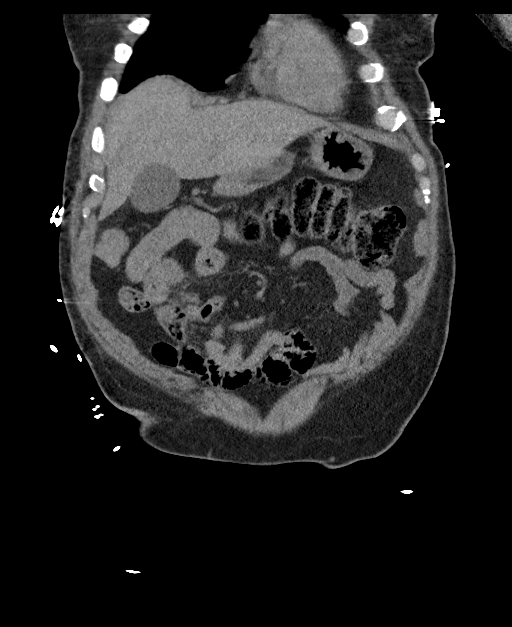
[im 53/120  soft-tissue]
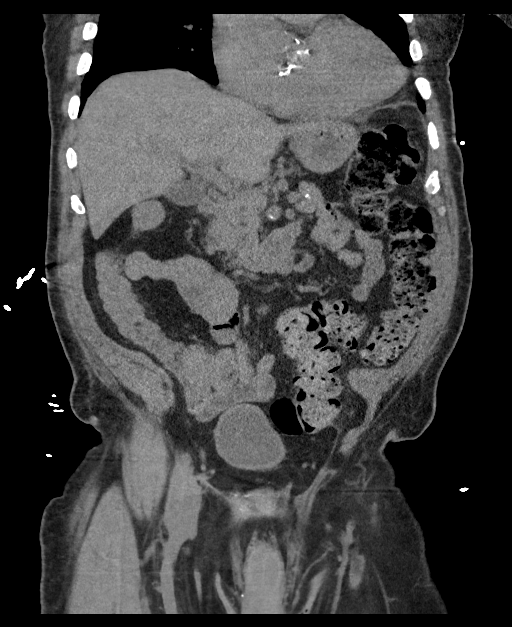
[im 67/120  soft-tissue]
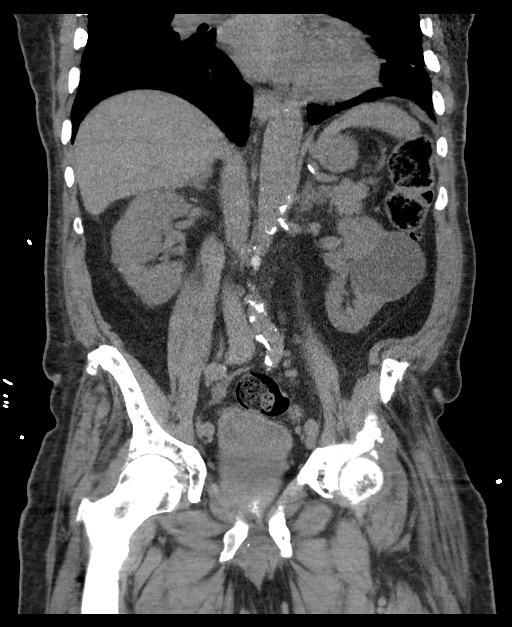

[15 of 46 positions shown; findings below may reference images not displayed]

FINDINGS: Lower chest: Subsegmental atelectasis in the lingula. Mild
hypoventilatory change in the dependent lungs. No confluent
consolidation or pleural fluid. Aortic valvular calcifications.
Normal heart size.

Hepatobiliary: No focal hepatic lesion on noncontrast exam. Mild
gallbladder distension but no calcified gallstone. No
pericholecystic inflammation. No biliary dilatation.

Pancreas: No ductal dilatation or inflammation.

Spleen: Normal in size without focal abnormality.

Adrenals/Urinary Tract: Adrenal nodule. Slight adrenal thickening.
No hydronephrosis. No perinephric edema. Cyst arising from the upper
pole of the left kidney measures 7.4 cm and has thin calcified
septations. Cyst arising from the lower left kidney measures 5.7 cm
without internal septations. Small low-density lesion arising from
the mid right kidney is too small to characterize but likely cyst.
Calcification in the right renal hilum is felt to be vascular rather
than nonobstructing stone. Urinary bladder is partially distended,
elevated by prominently enlarged prostate gland.

Stomach/Bowel: Small hiatal hernia. Partially distended stomach.
Mild peri pyloric gastric wall thickening, series 2, image 29. No
small bowel obstruction or inflammation. There are a few
fluid-filled small bowel loops in the pelvis without perienteric
inflammation or evidence of wall thickening. Appendix is not
definitively seen, no evidence of appendicitis. Nondistended
ascending colon which limits assessment for wall thickening.
Moderate stool in the transverse, descending, and sigmoid colon. No
wall thickening of these colonic segments. Occasional colonic
diverticula. There is question of irregular wall thickening
involving the right rectosigmoid junction, for example series 2,
images 75 and 79. no adjacent inflammation.

Vascular/Lymphatic: Moderately advanced aortic atherosclerosis. No
aortic aneurysm. Moderate branch atherosclerosis. No enlarged lymph
nodes in the abdomen or pelvis.

Reproductive: Prostate gland is prominently enlarged spanning 6.6 x
7.4 x 6.8 cm (volume = 170 cm^3). This causes mass effect on the
bladder base.

Other: No free air or ascites.  No abdominal wall hernia.

Musculoskeletal: Diffuse degenerative change throughout the spine
with degenerative disc disease and facet hypertrophy. Degenerative
change of the pubic symphysis and both hips. Cortical and trabecular
thickening involving the left hemipelvis typical of Paget's disease.
There may be some cortical thickening involving the posterior right
iliac bone as well.
IMPRESSION: 1. Questionable irregular wall thickening involving the right
rectosigmoid junction. Recommend correlation with physical and
direct visualization/colonoscopy to exclude underlying colonic
neoplasm.
2. Colonic diverticulosis without diverticulitis.
3. Mild peri pyloric gastric wall thickening, can be seen with
gastritis or peptic ulcer disease. Small hiatal hernia.
4. Prominently enlarged prostate gland causing mass effect on the
bladder base.
5. Left renal cysts, including a minimally complex cyst arising from
the upper pole with a calcified septation.
6. Paget's disease of the left hemipelvis. There may be some
cortical thickening involving the posterior right iliac bone as
well.

Aortic Atherosclerosis (FR2C3-SP8.8).

## 2022-04-08 IMAGING — DX DG FOOT 2V*L*
2 series · 2 of 2 positions shown · non-contrast
Comparison: None.

CLINICAL DATA: Left foot pain, no reported injury.

EXAM:
LEFT FOOT - 2 VIEW

[foot ap]
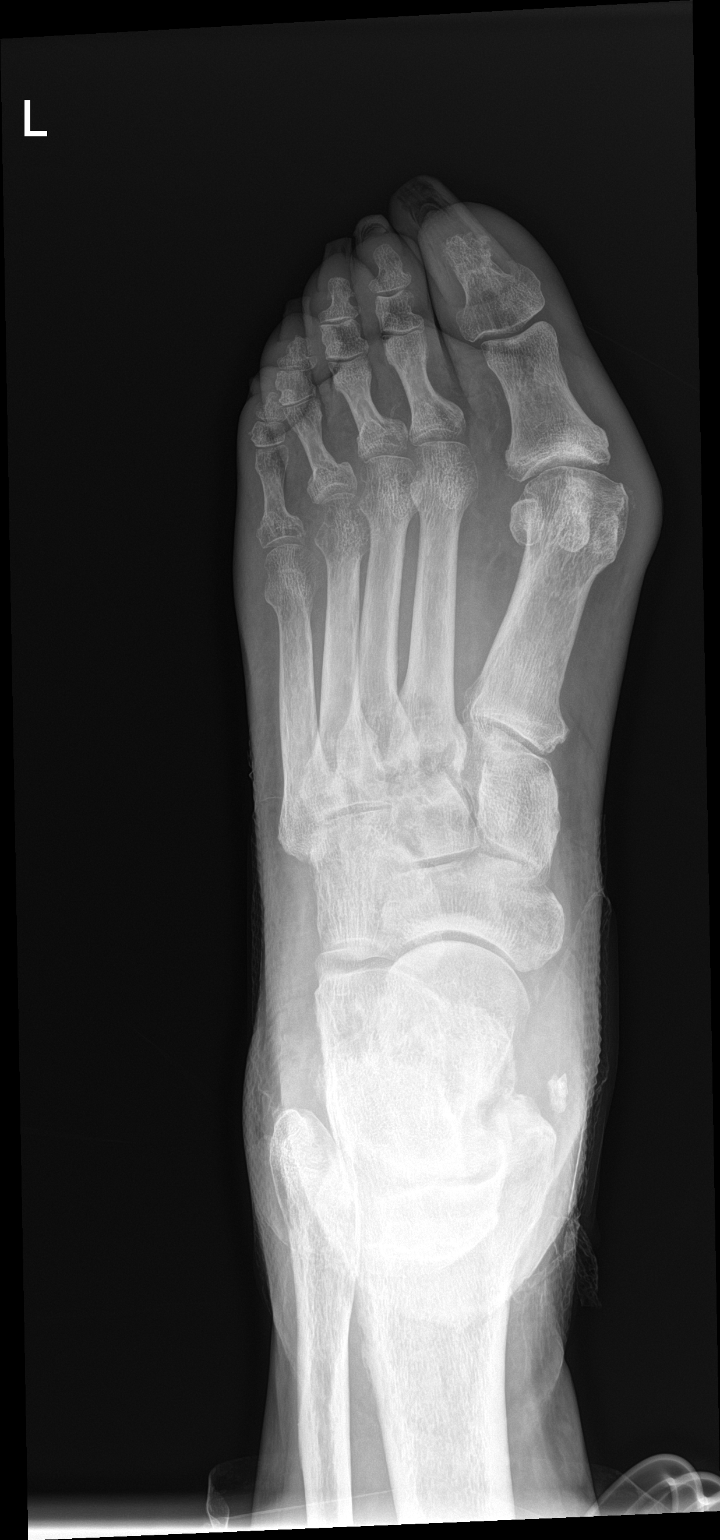

[foot lat]
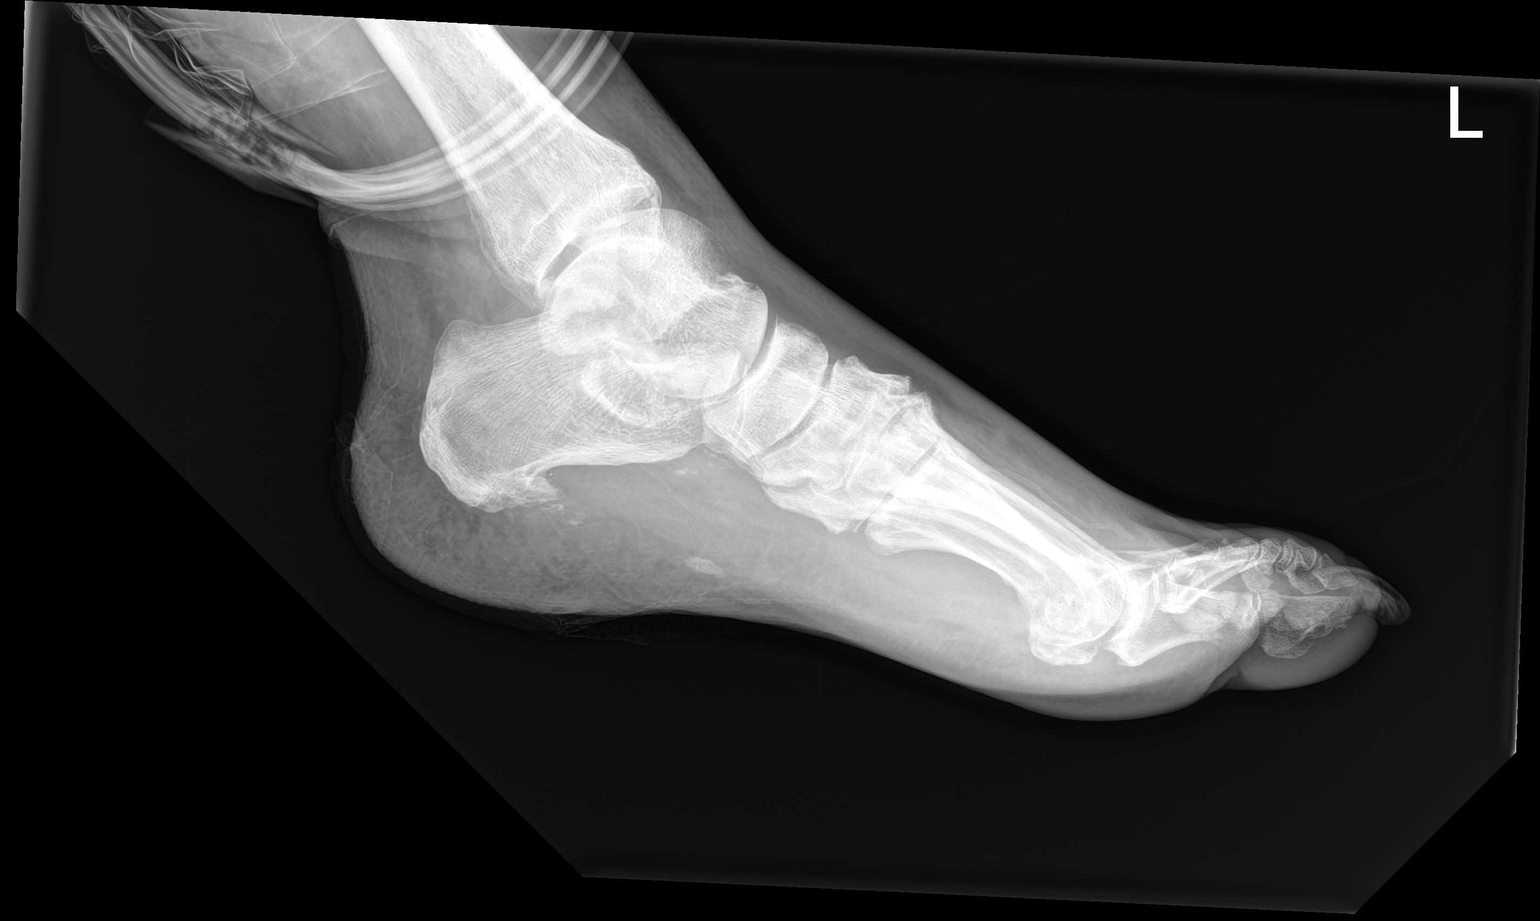

[2 of 2 positions shown; findings below may reference images not displayed]

FINDINGS: Hallux valgus with mild to moderate first metatarsophalangeal joint
osteoarthritis. Midfoot osteoarthritis. Calcaneal spurring.
Calcification in the plantar fascia. Probable degenerative changes
in the tibiotalar joint. No acute osseous abnormality.
IMPRESSION: 1. Midfoot and first metatarsophalangeal joint osteoarthritis with
associated hallux valgus.
2. Suspect tibiotalar joint osteoarthritis, suboptimally visualized.
